# Patient Record
Sex: Female | Born: 1985
Health system: Southern US, Community
[De-identification: ages and names within clinical notes are randomized; demographics above are authoritative.]

## PROBLEM LIST (undated history)

## (undated) ENCOUNTER — Inpatient Hospital Stay (HOSPITAL_COMMUNITY): Payer: Self-pay

## (undated) DIAGNOSIS — D573 Sickle-cell trait: Secondary | ICD-10-CM

## (undated) DIAGNOSIS — M419 Scoliosis, unspecified: Secondary | ICD-10-CM

## (undated) DIAGNOSIS — D259 Leiomyoma of uterus, unspecified: Secondary | ICD-10-CM

## (undated) DIAGNOSIS — D649 Anemia, unspecified: Secondary | ICD-10-CM

## (undated) DIAGNOSIS — A599 Trichomoniasis, unspecified: Secondary | ICD-10-CM

## (undated) HISTORY — PX: ROOT CANAL: SHX2363

## (undated) HISTORY — PX: DG SCOLIOSIS STUDY ENTIRE SPINE (ARMC HX): HXRAD1005

## (undated) HISTORY — PX: CHOLECYSTECTOMY: SHX55

---
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
## 2004-12-12 ENCOUNTER — Emergency Department: Payer: Self-pay | Admitting: Emergency Medicine

## 2005-01-11 ENCOUNTER — Emergency Department: Payer: Self-pay | Admitting: Emergency Medicine

## 2005-03-14 ENCOUNTER — Ambulatory Visit: Payer: Self-pay | Admitting: Unknown Physician Specialty

## 2005-06-27 ENCOUNTER — Emergency Department: Payer: Self-pay | Admitting: Emergency Medicine

## 2005-07-15 ENCOUNTER — Ambulatory Visit: Payer: Self-pay | Admitting: Family Medicine

## 2006-02-23 ENCOUNTER — Emergency Department: Payer: Self-pay | Admitting: Emergency Medicine

## 2006-02-23 ENCOUNTER — Other Ambulatory Visit: Payer: Self-pay

## 2007-01-01 ENCOUNTER — Emergency Department: Payer: Self-pay | Admitting: Emergency Medicine

## 2007-03-06 ENCOUNTER — Emergency Department: Payer: Self-pay | Admitting: Emergency Medicine

## 2007-05-02 ENCOUNTER — Emergency Department: Payer: Self-pay

## 2007-06-07 ENCOUNTER — Observation Stay: Payer: Self-pay | Admitting: Obstetrics and Gynecology

## 2007-06-08 ENCOUNTER — Ambulatory Visit: Payer: Self-pay | Admitting: Obstetrics and Gynecology

## 2007-07-11 ENCOUNTER — Observation Stay: Payer: Self-pay | Admitting: Obstetrics and Gynecology

## 2007-09-19 ENCOUNTER — Observation Stay: Payer: Self-pay | Admitting: Obstetrics and Gynecology

## 2007-10-23 ENCOUNTER — Observation Stay: Payer: Self-pay

## 2007-11-04 ENCOUNTER — Observation Stay: Payer: Self-pay

## 2007-11-05 ENCOUNTER — Inpatient Hospital Stay: Payer: Self-pay

## 2009-07-29 ENCOUNTER — Emergency Department: Payer: Self-pay | Admitting: Emergency Medicine

## 2010-03-22 ENCOUNTER — Emergency Department: Payer: Self-pay | Admitting: Emergency Medicine

## 2010-11-29 ENCOUNTER — Emergency Department: Payer: Self-pay | Admitting: Emergency Medicine

## 2012-08-28 ENCOUNTER — Emergency Department: Payer: Self-pay | Admitting: Emergency Medicine

## 2012-09-14 ENCOUNTER — Emergency Department: Payer: Self-pay | Admitting: Emergency Medicine

## 2013-01-21 ENCOUNTER — Emergency Department: Payer: Self-pay | Admitting: Emergency Medicine

## 2013-01-21 LAB — MONONUCLEOSIS SCREEN: Mono Test: NEGATIVE

## 2013-01-24 LAB — BETA STREP CULTURE(ARMC)

## 2013-03-13 ENCOUNTER — Emergency Department: Payer: Self-pay | Admitting: Emergency Medicine

## 2013-03-13 LAB — CBC
HGB: 13.6 g/dL (ref 12.0–16.0)
MCHC: 34.5 g/dL (ref 32.0–36.0)
MCV: 85 fL (ref 80–100)
RBC: 4.61 10*6/uL (ref 3.80–5.20)
WBC: 10.2 10*3/uL (ref 3.6–11.0)

## 2013-03-13 LAB — BASIC METABOLIC PANEL
Anion Gap: 3 — ABNORMAL LOW (ref 7–16)
Calcium, Total: 9.1 mg/dL (ref 8.5–10.1)
Co2: 29 mmol/L (ref 21–32)
EGFR (Non-African Amer.): >60
Osmolality: 269 (ref 275–301)
Potassium: 3.8 mmol/L (ref 3.5–5.1)
Sodium: 136 mmol/L (ref 136–145)

## 2013-03-13 LAB — URINALYSIS, COMPLETE
Bacteria: NONE SEEN
Bilirubin,UR: NEGATIVE
Glucose,UR: NEGATIVE mg/dL (ref 0–75)
Ketone: NEGATIVE
Leukocyte Esterase: NEGATIVE
Nitrite: NEGATIVE
Ph: 5 (ref 4.5–8.0)
Protein: NEGATIVE
RBC,UR: 2 /HPF (ref 0–5)
Specific Gravity: 1.015 (ref 1.003–1.030)

## 2013-12-05 ENCOUNTER — Emergency Department: Payer: Self-pay | Admitting: Emergency Medicine

## 2015-01-21 ENCOUNTER — Emergency Department (HOSPITAL_COMMUNITY)
Admission: EM | Admit: 2015-01-21 | Discharge: 2015-01-21 | Disposition: A | Discharge status: Home / Self Care | Payer: Self-pay | Attending: Emergency Medicine | Admitting: Emergency Medicine

## 2015-01-21 ENCOUNTER — Encounter (HOSPITAL_COMMUNITY): Payer: Self-pay | Admitting: Emergency Medicine

## 2015-01-21 ENCOUNTER — Emergency Department (HOSPITAL_COMMUNITY): Payer: Self-pay

## 2015-01-21 DIAGNOSIS — Z9049 Acquired absence of other specified parts of digestive tract: Secondary | ICD-10-CM | POA: Insufficient documentation

## 2015-01-21 DIAGNOSIS — H9191 Unspecified hearing loss, right ear: Secondary | ICD-10-CM | POA: Insufficient documentation

## 2015-01-21 DIAGNOSIS — J069 Acute upper respiratory infection, unspecified: Secondary | ICD-10-CM | POA: Insufficient documentation

## 2015-01-21 DIAGNOSIS — R197 Diarrhea, unspecified: Secondary | ICD-10-CM | POA: Insufficient documentation

## 2015-01-21 MED ORDER — HYDROCODONE-HOMATROPINE 5-1.5 MG/5ML PO SYRP: 5.0000 mL | ORAL_SOLUTION | Freq: Four times a day (QID) | ORAL | Status: DC | PRN

## 2015-01-21 MED ORDER — CETIRIZINE-PSEUDOEPHEDRINE ER 5-120 MG PO TB12: 1.0000 | ORAL_TABLET | Freq: Two times a day (BID) | ORAL | Status: DC

## 2015-01-21 NOTE — ED Provider Notes (Signed)
CSN: [id number]     Arrival date & time 01/21/15  2155 History  This chart was scribed for Alvina Chou, PA-C, working with Sherwood Gambler, MD by Steva Colder, ED Scribe. The patient was seen in room WTR8/WTR8 at 11:09 PM.    Chief Complaint  Patient presents with  . Cough  . Diarrhea      The history is provided by the patient. No language interpreter was used.    HPI Comments: Anna Beck is a 29 y.o. female who presents to the Emergency Department complaining of worsening productive cough onset 7 days.  She states that she is having associated symptoms of diarrhea, hearing loss right ear, chest wall tenderness, and voice loss. She states that she has tried mucinex, sudafed, vitamin C, and theraflu, with no relief for her symptoms. She denies fever, chills, sinus pressure, and any other symptoms. Denies smoking or hx of asthma.   No past medical history on file. Past Surgical History  Procedure Laterality Date  . Cholecystectomy    . Dg scoliosis study entire spine ([facility] hx)      States corrective surgery of scoliosis   No family history on file. History  Substance Use Topics  . Smoking status: Not on file  . Smokeless tobacco: Not on file  . Alcohol Use: Not on file   OB History    No data available     Review of Systems  Constitutional: Negative for fever and chills.  HENT: Positive for hearing loss (right ear) and voice change. Negative for sinus pressure.   Respiratory: Positive for cough.   Gastrointestinal: Positive for diarrhea.      Allergies  Review of patient's allergies indicates no known allergies.  Home Medications   Prior to Admission medications   Not on File   BP 117/79 mmHg  Pulse 85  Temp(Src) 98.2 F (36.8 C) (Oral)  Resp 18  SpO2 99%  LMP 01/01/2015 Physical Exam  Constitutional: She is oriented to person, place, and time. She appears well-developed and well-nourished. No distress.  HENT:  Head: Normocephalic and atraumatic.   Left Ear: Tympanic membrane and ear canal normal.  Mouth/Throat: Oropharynx is clear and moist. No oropharyngeal exudate or posterior oropharyngeal erythema.  Right external ear canal mildly swollen. No tenderness with retraction of right auricle. Left ear unremarkable.  Eyes: EOM are normal.  Neck: Neck supple. No tracheal deviation present.  Cardiovascular: Normal rate.   Pulmonary/Chest: Effort normal and breath sounds normal. No respiratory distress. She has no wheezes. She has no rales.  Abdominal: Soft. She exhibits no distension. There is no tenderness. There is no rebound.  Musculoskeletal: Normal range of motion.  Neurological: She is alert and oriented to person, place, and time.  Skin: Skin is warm and dry.  Psychiatric: She has a normal mood and affect. Her behavior is normal.  Nursing note and vitals reviewed.   ED Course  Procedures (including critical care time) DIAGNOSTIC STUDIES: Oxygen Saturation is 99% on RA, nl by my interpretation.    COORDINATION OF CARE: 11:14 PM-Discussed treatment plan which includes decongestant, hycadon with pt at bedside and pt agreed to plan.   Labs Review Labs Reviewed - No data to display  Imaging Review Dg Chest 2 View (if Patient Has Fever And/or Copd)  01/21/2015   CLINICAL DATA:  Chest pain, productive cough starting today  EXAM: CHEST  2 VIEW  COMPARISON:  None.  FINDINGS: Cardiomediastinal silhouette is unremarkable. There is dextroscoliosis of thoracic spine.  Thoracic fixation rods are noted thoracic spine. No acute infiltrate or pulmonary edema.  IMPRESSION: No active cardiopulmonary disease. Postsurgical changes thoracic spine. Mild thoracic dextroscoliosis.   Electronically Signed   By: Lahoma Crocker M.D.   On: 01/21/2015 22:52     EKG Interpretation None      MDM   Final diagnoses:  URI (upper respiratory infection)   11:31 PM Patient's vitals stable and patient afebrile. I personally reviewed the xray which shows no  acute changes. Patient likely has viral URI and will be discharged with hycodan and zyrtec D. Patient instructed to return with worsening or concerning symptoms.   I personally performed the services described in this documentation, which was scribed in my presence. The recorded information has been reviewed and is accurate.    [address] [place], PA-C 01/21/15 9872  Sherwood Gambler, MD 01/27/15 1432

## 2015-01-21 NOTE — ED Notes (Signed)
Pt states having a cough ongoing since last Thursday/friday. States it is occasionally productive, unsure what color sputum. States her chest is sore and her voice is hoarse from coughing so much. States it's also difficult for her to hear out of her left ear where it feels stopped up.

## 2015-01-21 NOTE — Discharge Instructions (Signed)
Take hycodan as needed for cough. Take zyrtec D as directed for congestion relief. Refer to attached documents for more information. Return to the ED with worsening or concerning symptoms.

## 2015-05-26 ENCOUNTER — Emergency Department
Admission: EM | Admit: 2015-05-26 | Discharge: 2015-05-26 | Disposition: A | Discharge status: Home / Self Care | Payer: Self-pay | Attending: Student | Admitting: Student

## 2015-05-26 ENCOUNTER — Encounter: Payer: Self-pay | Admitting: Emergency Medicine

## 2015-05-26 DIAGNOSIS — Z79899 Other long term (current) drug therapy: Secondary | ICD-10-CM | POA: Insufficient documentation

## 2015-05-26 DIAGNOSIS — K0381 Cracked tooth: Secondary | ICD-10-CM | POA: Insufficient documentation

## 2015-05-26 DIAGNOSIS — R0981 Nasal congestion: Secondary | ICD-10-CM | POA: Insufficient documentation

## 2015-05-26 DIAGNOSIS — K0889 Other specified disorders of teeth and supporting structures: Secondary | ICD-10-CM | POA: Insufficient documentation

## 2015-05-26 DIAGNOSIS — Z87891 Personal history of nicotine dependence: Secondary | ICD-10-CM | POA: Insufficient documentation

## 2015-05-26 MED ORDER — IBUPROFEN 800 MG PO TABS: 800.0000 mg | ORAL_TABLET | Freq: Three times a day (TID) | ORAL | Status: DC | PRN

## 2015-05-26 MED ORDER — FEXOFENADINE-PSEUDOEPHED ER 60-120 MG PO TB12: 1.0000 | ORAL_TABLET | Freq: Two times a day (BID) | ORAL | Status: DC

## 2015-05-26 MED ORDER — AMOXICILLIN 500 MG PO CAPS: 500.0000 mg | ORAL_CAPSULE | Freq: Three times a day (TID) | ORAL | Status: DC

## 2015-05-26 NOTE — ED Notes (Signed)
Pt C/O of right upper dental pain since Sunday. Pt also reports having pressure in sinus area with neck pain.

## 2015-05-26 NOTE — ED Provider Notes (Signed)
[facility] Emergency Department Provider Note  ____________________________________________  Time seen: Approximately 8:41 AM  I have reviewed the triage vital signs and the nursing notes.   HISTORY  Chief Complaint Dental Pain    HPI [name] is a 29 y.o. female patient complaining of 2 days of dental pain. Patient shows a dental pain is secondary to a fractured tooth or a sinus congestion but she is having. Patient states she also has a frontal headache. Patient denies any fevers chills associated this complaint. No palliative measures taken for this complaint. Patient is rating her pain discomfort as a 5/10. No palliative measures taken for this complaint patient does have a history allergic rhinitis.   History reviewed. No pertinent past medical history.  There are no active problems to display for this patient.   Past Surgical History  Procedure Laterality Date  . Cholecystectomy    . Dg scoliosis study entire spine ([facility] hx)      States corrective surgery of scoliosis    Current Outpatient Rx  Name  Route  Sig  Dispense  Refill  . amoxicillin (AMOXIL) 500 MG capsule   Oral   Take 1 capsule (500 mg total) by mouth 3 (three) times daily.   30 capsule   0   . cetirizine-pseudoephedrine (ZYRTEC-D) 5-120 MG per tablet   Oral   Take 1 tablet by mouth 2 (two) times daily.   30 tablet   0   . fexofenadine-pseudoephedrine (ALLEGRA-D) 60-120 MG 12 hr tablet   Oral   Take 1 tablet by mouth 2 (two) times daily.   30 tablet   0   . HYDROcodone-homatropine (HYCODAN) 5-1.5 MG/5ML syrup   Oral   Take 5 mLs by mouth every 6 (six) hours as needed for cough.   120 mL   0   . ibuprofen (ADVIL,MOTRIN) 800 MG tablet   Oral   Take 1 tablet (800 mg total) by mouth every 8 (eight) hours as needed for moderate pain.   15 tablet   0     Allergies Review of patient's allergies indicates no known allergies.  History reviewed. No pertinent  family history.  Social History Social History  Substance Use Topics  . Smoking status: Former Research scientist (life sciences)  . Smokeless tobacco: None  . Alcohol Use: Yes    Review of Systems Constitutional: No fever/chills Eyes: No visual changes. ENT: No sore throat. Dental pain. Sinus congestion. Cardiovascular: Denies chest pain. Respiratory: Denies shortness of breath. Gastrointestinal: No abdominal pain.  No nausea, no vomiting.  No diarrhea.  No constipation. Genitourinary: Negative for dysuria. Musculoskeletal: Negative for back pain. Skin: Negative for rash. Neurological: Positive for headaches, denies  focal weakness or numbness. 10-point ROS otherwise negative.  ____________________________________________   PHYSICAL EXAM:  VITAL SIGNS: ED Triage Vitals  Enc Vitals Group     BP 05/26/15 0822 112/96 mmHg     Pulse Rate 05/26/15 0822 65     Resp 05/26/15 0822 15     Temp 05/26/15 0822 98.1 F (36.7 C)     Temp Source 05/26/15 0822 Oral     SpO2 05/26/15 0822 100 %     Weight 05/26/15 0822 207 lb (93.895 kg)     Height 05/26/15 0822 5\' 5"  (1.651 m)     Head Cir --      Peak Flow --      Pain Score --      Pain Loc --      Pain  Edu? --      Excl. in [place]? --     Constitutional: Alert and oriented. Well appearing and in no acute distress. Eyes: Conjunctivae are normal. PERRL. EOMI. Head: Atraumatic. Nose: Bilateral maxillary guarding. Edematous nasal turbinates.   Mouth/Throat: Mucous membranes are moist.  Oropharynx non-erythematous. Postnasal drainage. Fracture right molar. Neck: No stridor.  No cervical spine tenderness to palpation. Hematological/Lymphatic/Immunilogical: No cervical lymphadenopathy. Cardiovascular: Normal rate, regular rhythm. Grossly normal heart sounds.  Good peripheral circulation. Respiratory: Normal respiratory effort.  No retractions. Lungs CTAB. Gastrointestinal: Soft and nontender. No distention. No abdominal bruits. No CVA  tenderness. Musculoskeletal: No lower extremity tenderness nor edema.  No joint effusions. Neurologic:  Normal speech and language. No gross focal neurologic deficits are appreciated. No gait instability. Skin:  Skin is warm, dry and intact. No rash noted. Psychiatric: Mood and affect are normal. Speech and behavior are normal.  ____________________________________________   LABS (all labs ordered are listed, but only abnormal results are displayed)  Labs Reviewed - No data to display ____________________________________________  EKG   ____________________________________________  RADIOLOGY   ____________________________________________   PROCEDURES  Procedure(s) performed: None  Critical Care performed: No  ____________________________________________   INITIAL IMPRESSION / ASSESSMENT AND PLAN / ED COURSE  Pertinent labs & imaging results that were available during my care of the patient were reviewed by me and considered in my medical decision making (see chart for details).  Sinus congestion and dental pain. Patient will be given prescription for Allegra-D, and ibuprofen. Patient advised follow-up with the walk-in dental clinic today. Patient given a work note for today. ____________________________________________   FINAL CLINICAL IMPRESSION(S) / ED DIAGNOSES  Final diagnoses:  Sinus congestion  Pain, dental      Sable Feil, PA-C 05/26/15 3235  Joanne Gavel, MD 05/26/15 [phone]

## 2015-05-26 NOTE — Discharge Instructions (Signed)
Follow up with dental clinic [facility]  [place] [facility] and [place] [email or web address] [phone])  [facility] [phone])  [place] [phone] ext [phone])  [place] [phone])  [facility] [phone]) This clinic caters to the indigent population and is on a lottery system. Location: [employer] [facility], [place], [place], [place] Clinic Hours: Wednesdays from 6pm - 9pm, patients seen by a lottery system. For dates, call or go to [email or web address] Services: Cleanings, fillings and simple extractions. Payment Options: DENTAL WORK IS FREE OF CHARGE. Bring proof of income or support. Best way to get seen: Arrive at 5:15 pm - this is a lottery, NOT first come/first serve, so arriving earlier will not increase your chances of being seen.     [facility] [phone] Select option 1 for emergencies   Location: [facility], [place], [address] [place] Clinic Hours: No walk-ins accepted - call the day before to schedule an appointment. Check in times are 9:30 am and 1:30 pm. Services: Simple extractions, temporary fillings, pulpectomy/pulp debridement, uncomplicated abscess drainage. Payment Options: PAYMENT IS DUE AT THE TIME OF SERVICE.  Fee is usually $100-200, additional surgical procedures (e.g. abscess drainage) may be extra. Cash, checks, Visa/MasterCard accepted.  Can file Medicaid if patient is covered for dental - patient should call case worker to check. No discount for [facility] patients. Best way to get seen: MUST call the day before and get onto the schedule. Can usually be seen the next 1-2 days. No walk-ins accepted.     [place] [phone]   Location: [place], [place] Clinic Hours: M, W, Th, F 8am or 1:30pm, Tues 9a or 1:30 - first come/first served. Services: Simple extractions, temporary fillings, uncomplicated abscess drainage.  You do not need to be an [facility] resident. Payment Options: PAYMENT IS DUE AT THE TIME OF SERVICE. Dental insurance, otherwise sliding scale - bring proof of income or support. Depending on income and treatment needed, cost is usually $50-200. Best way to get seen: Arrive early as it is first come/first served.     [facility] [phone]   Location: [place] [facility] Hours: Mon-Thu 8a-5p Services: Most basic dental services including extractions and fillings. Payment Options: PAYMENT IS DUE AT THE TIME OF SERVICE. Sliding scale, up to 50% off - bring proof if income or support. Medicaid with dental option accepted. Best way to get seen: Call to schedule an appointment, can usually be seen within 2 weeks OR they will try to see walk-ins - show up at [place] or 2p (you may have to wait).     [facility] RESIDENTS ONLY   Location: [facility], [place] [address] [place], [place] [address] Clinic Hours: By appointment only. Monday - Thursday 8am-5pm, Friday 8am-12pm Services: Cleanings, fillings, extractions. Payment Options: PAYMENT IS DUE AT THE TIME OF SERVICE. Cash, Visa or MasterCard. Sliding scale - $30 minimum per service. Best way to get seen: Come in to office, complete packet and make an appointment - need proof of income or support monies for each household member and proof of [facility] residence. Usually takes about a month to get in.     [facility] [phone]  Location: [address], [place] Clinic Hours: Walk-in Urgent Care Dental Services are  offered Monday-Friday mornings only. The numbers of emergencies accepted daily is limited to the number of providers available. Maximum 15 - Mondays, Wednesdays & Thursdays Maximum 10 - Tuesdays & Fridays Services: You do not need to be a [facility] resident to be seen for a dental emergency. Emergencies are defined as pain, swelling, abnormal bleeding, or dental trauma. Walkins will receive x-rays if needed. NOTE: Dental cleaning is not an emergency. Payment Options: PAYMENT IS DUE AT THE TIME OF SERVICE. Minimum co-pay is $40.00 for uninsured patients. Minimum co-pay is $3.00 for Medicaid with dental coverage. Dental Insurance is accepted and must be presented at time of visit. Medicare does not cover dental. Forms of payment: Cash, credit card, checks. Best way to get seen: If not previously registered with the clinic, walk-in dental registration begins at 7:15 am and is on a first come/first serve basis. If previously registered with the clinic, call to make an appointment.     [facility] [place] ONLY   Location: [address], [place], [place] Clinic Hours: Mon-Thu 10a-2p Services: Extractions only! Payment Options: FREE (donations accepted) - bring proof of income or support Best way to get seen: Call and schedule an appointment OR come at 8am on the 1st Monday of every month (except for holidays) when it is first come/first served.     [facility] [phone]   Location: [place], [place] Clinic Hours: Friday mornings Services, Payment Options, Best way to get seen: Call for info

## 2016-07-12 ENCOUNTER — Encounter (HOSPITAL_COMMUNITY): Payer: Self-pay | Admitting: Family Medicine

## 2016-07-12 ENCOUNTER — Emergency Department (HOSPITAL_COMMUNITY)
Admission: EM | Admit: 2016-07-12 | Discharge: 2016-07-13 | Disposition: A | Discharge status: Home / Self Care | Payer: BLUE CROSS/BLUE SHIELD | Attending: Emergency Medicine | Admitting: Emergency Medicine

## 2016-07-12 DIAGNOSIS — N10 Acute tubulo-interstitial nephritis: Secondary | ICD-10-CM | POA: Diagnosis not present

## 2016-07-12 DIAGNOSIS — R1031 Right lower quadrant pain: Secondary | ICD-10-CM | POA: Diagnosis present

## 2016-07-12 DIAGNOSIS — Z87891 Personal history of nicotine dependence: Secondary | ICD-10-CM | POA: Diagnosis not present

## 2016-07-12 DIAGNOSIS — Z79899 Other long term (current) drug therapy: Secondary | ICD-10-CM | POA: Insufficient documentation

## 2016-07-12 DIAGNOSIS — N12 Tubulo-interstitial nephritis, not specified as acute or chronic: Secondary | ICD-10-CM | POA: Diagnosis not present

## 2016-07-12 LAB — COMPREHENSIVE METABOLIC PANEL
ALT: 11 U/L — ABNORMAL LOW (ref 14–54)
AST: 15 U/L (ref 15–41)
Albumin: 4 g/dL (ref 3.5–5.0)
Alkaline Phosphatase: 60 U/L (ref 38–126)
Anion gap: 7 (ref 5–15)
BUN: 8 mg/dL (ref 6–20)
CHLORIDE: 105 mmol/L (ref 101–111)
CO2: 26 mmol/L (ref 22–32)
Calcium: 9.3 mg/dL (ref 8.9–10.3)
Creatinine, Ser: 0.66 mg/dL (ref 0.44–1.00)
GFR calc non Af Amer: >60 mL/min (ref >60)
Glucose, Bld: 95 mg/dL (ref 65–99)
Potassium: 3.5 mmol/L (ref 3.5–5.1)
Sodium: 138 mmol/L (ref 135–145)
Total Bilirubin: 0.4 mg/dL (ref 0.3–1.2)
Total Protein: 7.2 g/dL (ref 6.5–8.1)

## 2016-07-12 LAB — URINALYSIS, ROUTINE W REFLEX MICROSCOPIC
BILIRUBIN URINE: NEGATIVE
Glucose, UA: NEGATIVE mg/dL
Ketones, ur: NEGATIVE mg/dL
Nitrite: POSITIVE — AB
PH: 6 (ref 5.0–8.0)
Protein, ur: NEGATIVE mg/dL
Specific Gravity, Urine: 1.011 (ref 1.005–1.030)

## 2016-07-12 LAB — POC URINE PREG, ED: Preg Test, Ur: NEGATIVE

## 2016-07-12 LAB — CBC
HEMATOCRIT: 36.5 % (ref 36.0–46.0)
Hemoglobin: 12.4 g/dL (ref 12.0–15.0)
MCH: 28.4 pg (ref 26.0–34.0)
MCHC: 34 g/dL (ref 30.0–36.0)
MCV: 83.7 fL (ref 78.0–100.0)
PLATELETS: 287 10*3/uL (ref 150–400)
RBC: 4.36 MIL/uL (ref 3.87–5.11)
RDW: 13.4 % (ref 11.5–15.5)
WBC: 7.9 10*3/uL (ref 4.0–10.5)

## 2016-07-12 LAB — LIPASE, BLOOD: LIPASE: 28 U/L (ref 11–51)

## 2016-07-12 MED ORDER — MORPHINE SULFATE (PF) 4 MG/ML IV SOLN
4.0000 mg | Freq: Once | INTRAVENOUS | Status: AC
  Administered 2016-07-12: 4 mg via INTRAVENOUS
  Filled 2016-07-12: qty 1

## 2016-07-12 MED ORDER — DEXTROSE 5 % IV SOLN
1.0000 g | INTRAVENOUS | Status: DC
  Administered 2016-07-12: 1 g via INTRAVENOUS
  Filled 2016-07-12: qty 10

## 2016-07-12 MED ORDER — OXYCODONE HCL 5 MG PO TABS: 10.0000 mg | ORAL_TABLET | Freq: Once | ORAL | Status: DC

## 2016-07-12 MED ORDER — KETOROLAC TROMETHAMINE 15 MG/ML IJ SOLN: 15.0000 mg | Freq: Once | INTRAMUSCULAR | Status: DC

## 2016-07-12 NOTE — Progress Notes (Signed)
EDCM went to speak to patient at bedside, however nurse at bedside at that time.

## 2016-07-12 NOTE — ED Triage Notes (Signed)
Patient is reporting right lower abd pain and back pain with pain when urinating. Patient reports symptoms started 3 hours ago. Denies nausea, vomiting, diarrhea, or fever. Took IBUPROFEN 400mg  about 1.5 hrs ago with no relief.

## 2016-07-12 NOTE — ED Notes (Signed)
Pt reminded of the need for urine.  

## 2016-07-12 NOTE — ED Provider Notes (Signed)
Anna Beck   CSN: [id number] Arrival date & time: 07/12/16  1819     History   Chief Complaint Chief Complaint  Patient presents with  . Abdominal Pain  . Back Pain    HPI Anna Beck is a 30 y.o. female.  HPI Pt comes in with cc of abd pain. PT has hx of cholecystectomy. PT reports that for the past 2-3 days she has been having intermittent R sided abd and back pain. Pain is sharp. Starting about 3 hours ago though, she has had constant RLQ abdp ain radiating to the back. PT also reports some dysuria and urinary frequency for the past several days. She has no n/v/f/c. Pt has no vaginal discharge/bleeding.  History reviewed. No pertinent past medical history.  There are no active problems to display for this patient.   Past Surgical History:  Procedure Laterality Date  . CHOLECYSTECTOMY    . DG SCOLIOSIS STUDY ENTIRE SPINE ([facility] HX)     States corrective surgery of scoliosis    OB History    No data available       Home Medications    Prior to Admission medications   Medication Sig Start Date End Date Taking? Authorizing Provider  ibuprofen (ADVIL,MOTRIN) 200 MG tablet Take 400 mg by mouth every 6 (six) hours as needed for moderate pain.   Yes Historical Provider, MD  amoxicillin (AMOXIL) 500 MG capsule Take 1 capsule (500 mg total) by mouth 3 (three) times daily. Patient not taking: Reported on 07/12/2016 05/26/15   Sable Feil, PA-C  cetirizine-pseudoephedrine (ZYRTEC-D) 5-120 MG per tablet Take 1 tablet by mouth 2 (two) times daily. Patient not taking: Reported on 07/12/2016 01/21/15   Alvina Chou, PA-C  fexofenadine-pseudoephedrine (ALLEGRA-D) 60-120 MG 12 hr tablet Take 1 tablet by mouth 2 (two) times daily. Patient not taking: Reported on 07/12/2016 05/26/15   Sable Feil, PA-C  HYDROcodone-homatropine [facility]) 5-1.5 MG/5ML syrup Take 5 mLs by mouth every 6 (six) hours as needed for cough. Patient not taking: Reported  on 07/12/2016 01/21/15   Alvina Chou, PA-C  ibuprofen (ADVIL,MOTRIN) 800 MG tablet Take 1 tablet (800 mg total) by mouth every 8 (eight) hours as needed for moderate pain. Patient not taking: Reported on 07/12/2016 05/26/15   Sable Feil, PA-C    Family History History reviewed. No pertinent family history.  Social History Social History  Substance Use Topics  . Smoking status: Former Research scientist (life sciences)  . Smokeless tobacco: Never Used  . Alcohol use Yes     Comment: Twice a month     Allergies   Patient has no known allergies.   Review of Systems Review of Systems  ROS 10 Systems reviewed and are negative for acute change except as noted in the HPI.     Physical Exam Updated Vital Signs BP 105/65 (BP Location: Left Arm)   Pulse 71   Temp 97.5 F (36.4 C) (Oral)   Resp 18   Ht 5\' 6"  (1.676 m)   Wt 220 lb (99.8 kg)   LMP 06/29/2016 Comment: Upreg neg 07/12/16  SpO2 100%   BMI 35.51 kg/m   Physical Exam  Constitutional: She is oriented to person, place, and time. She appears well-developed and well-nourished.  HENT:  Head: Normocephalic and atraumatic.  Eyes: EOM are normal. Pupils are equal, round, and reactive to light.  Neck: Neck supple.  Cardiovascular: Normal rate, regular rhythm and normal heart sounds.   No murmur heard. Pulmonary/Chest: Effort  normal. No respiratory distress.  Abdominal: Soft. She exhibits no distension. There is tenderness. There is guarding. There is no rebound.  RLQ abdominal tenderness, with guarding. + flank tenderness on the R side as well.  Neurological: She is alert and oriented to person, place, and time.  Skin: Skin is warm and dry.  Nursing Beck and vitals reviewed.    ED Treatments / Results  Labs (all labs ordered are listed, but only abnormal results are displayed) Labs Reviewed  COMPREHENSIVE METABOLIC PANEL - Abnormal; Notable for the following:       Result Value   ALT 11 (*)    All other components within  normal limits  URINALYSIS, ROUTINE W REFLEX MICROSCOPIC - Abnormal; Notable for the following:    APPearance HAZY (*)    Hgb urine dipstick SMALL (*)    Nitrite POSITIVE (*)    Leukocytes, UA MODERATE (*)    Bacteria, UA RARE (*)    Squamous Epithelial / LPF 0-5 (*)    All other components within normal limits  URINE CULTURE  LIPASE, BLOOD  CBC  POC URINE PREG, ED    EKG  EKG Interpretation None       Radiology No results found.  Procedures Procedures (including critical care time)  Medications Ordered in ED Medications  cefTRIAXone (ROCEPHIN) 1 g in dextrose 5 % 50 mL IVPB (0 g Intravenous Stopped 07/12/16 2201)  iopamidol (ISOVUE-300) 61 % injection (not administered)  sodium chloride 0.9 % injection (not administered)  morphine 4 MG/ML injection 4 mg (4 mg Intravenous Given 07/12/16 2126)  iopamidol (ISOVUE-300) 61 % injection 100 mL (100 mLs Intravenous Contrast Given 07/13/16 0027)     Initial Impression / Assessment and Plan / ED Course  I have reviewed the triage vital signs and the nursing notes.  Pertinent labs & imaging results that were available during my care of the patient were reviewed by me and considered in my medical decision making (see chart for details).  Clinical Course as of Jul 13 116  Wed Jul 13, 2016  0013 Although pain is overall improved, pt is having focal RLQ tenderness over Mcburney's. CT ordered to r/o appendicitis.  [AN]    Clinical Course User Index [AN] Anna Biles, MD    Pt comes in with cc of R sided abd pain. Pain intermittent initially, but now constant and more severe. PT having urinary symptoms for the past several days consistent with UTI and UA is nitrite +. Pt has no pelvic disorders.  DDX Pyelonephritis Ureteral colic Appendicitis Ovarian torsion PID  Based on hx and exam, pelvic etiology deemed less likely. We will start treating patient as pyelonephritis and repeat abd pain to see if CT is needed. Pt  has pretty focal RLQ tenderness and she thinks that pain in the abdomen is worse than her back, which makes me wonder if she has both a pyelonephritis and additional process going on.   Final Clinical Impressions(s) / ED Diagnoses   Final diagnoses:  None    New Prescriptions New Prescriptions   No medications on file     Anna Biles, MD 07/13/16 [phone]

## 2016-07-13 ENCOUNTER — Emergency Department (HOSPITAL_COMMUNITY): Payer: BLUE CROSS/BLUE SHIELD

## 2016-07-13 MED ORDER — HYDROCODONE-ACETAMINOPHEN 5-325 MG PO TABS: 1.0000 | ORAL_TABLET | Freq: Four times a day (QID) | ORAL | 0 refills | Status: DC | PRN

## 2016-07-13 MED ORDER — SODIUM CHLORIDE 0.9 % IJ SOLN
INTRAMUSCULAR | Status: AC
  Filled 2016-07-13: qty 50

## 2016-07-13 MED ORDER — IOPAMIDOL (ISOVUE-300) INJECTION 61%
100.0000 mL | Freq: Once | INTRAVENOUS | Status: AC | PRN
  Administered 2016-07-13: 100 mL via INTRAVENOUS

## 2016-07-13 MED ORDER — IOPAMIDOL (ISOVUE-300) INJECTION 61%
INTRAVENOUS | Status: AC
  Filled 2016-07-13: qty 100

## 2016-07-13 MED ORDER — ONDANSETRON 4 MG PO TBDP: 4.0000 mg | ORAL_TABLET | Freq: Three times a day (TID) | ORAL | 0 refills | Status: DC | PRN

## 2016-07-13 MED ORDER — CEPHALEXIN 500 MG PO CAPS: 500.0000 mg | ORAL_CAPSULE | Freq: Two times a day (BID) | ORAL | 0 refills | Status: DC

## 2016-07-13 NOTE — Discharge Instructions (Signed)
You have a kidney infection based on our workup in the Er. Take the antibiotics prescribed and see your doctor in a week.  Please return to the ER if your symptoms worsen; you have increased pain, fevers, chills, inability to keep any medications down, confusion. Otherwise see the outpatient doctor as requested.

## 2016-07-15 LAB — URINE CULTURE: Culture: >=100000 — AB

## 2016-07-16 ENCOUNTER — Telehealth: Payer: Self-pay

## 2016-07-16 NOTE — Progress Notes (Signed)
ED Antimicrobial Stewardship Positive Culture Follow Up    Chief Complaint  Patient presents with  . Abdominal Pain  . Back Pain    Recent Results (from the past 720 hour(s))  Urine culture     Status: Abnormal   Collection Time: 07/12/16  6:30 PM  Result Value Ref Range Status   Specimen Description URINE, RANDOM  Final   Special Requests NONE  Final   Culture (A)  Final    >=100,000 COLONIES/mL ESCHERICHIA COLI Confirmed Extended Spectrum Beta-Lactamase Producer (ESBL) Performed at [facility]    Report Status 07/15/2016 FINAL  Final   Organism ID, Bacteria ESCHERICHIA COLI (A)  Final      Susceptibility   Escherichia coli - MIC*    AMPICILLIN >=32 RESISTANT Resistant     CEFAZOLIN >=64 RESISTANT Resistant     CEFTRIAXONE >=64 RESISTANT Resistant     CIPROFLOXACIN <=0.25 SENSITIVE Sensitive     GENTAMICIN <=1 SENSITIVE Sensitive     IMIPENEM <=0.25 SENSITIVE Sensitive     NITROFURANTOIN 64 INTERMEDIATE Intermediate     TRIMETH/SULFA >=320 RESISTANT Resistant     AMPICILLIN/SULBACTAM 16 INTERMEDIATE Intermediate     PIP/TAZO <=4 SENSITIVE Sensitive     Extended ESBL POSITIVE Resistant     * >=100,000 COLONIES/mL ESCHERICHIA COLI    [x]  Treated with Cephalexin, organism resistant to prescribed antimicrobial []  Patient discharged originally without antimicrobial agent and treatment is now indicated  New antibiotic prescription: Ciprofloxacin 500 mg PO BID x 7 days  ED Provider: Jerilynn Mages. Camprubi-Soms, PA-C     Hughes Better, PharmD, BCPS Clinical Pharmacist 07/16/2016 9:38 AM

## 2016-07-16 NOTE — Telephone Encounter (Signed)
Post ED Visit - Positive Culture Follow-up: Unsuccessful Patient Follow-up  Culture assessed and recommendations reviewed by: []  Elenor Quinones, Pharm.D. []  Heide Guile, Pharm.D., BCPS []  Parks Neptune, Pharm.D. []  Alycia Rossetti, Pharm.D., BCPS []  [place], Pharm.D., BCPS, AAHIVP []  Legrand Como, Pharm.D., BCPS, AAHIVP []  Cassie Stewart, Pharm.D. []  Stephens November, Pharm.D. Ebony Hail Masters Pharm D Positive urine culture  []  Patient discharged without antimicrobial prescription and treatment is now indicated [x]  Organism is resistant to prescribed ED discharge antimicrobial []  Patient with positive blood cultures   Unable to contact patient after 3 attempts, letter will be sent to address on file  Genia Del 07/16/2016, 10:35 AM

## 2016-08-02 ENCOUNTER — Telehealth (HOSPITAL_BASED_OUTPATIENT_CLINIC_OR_DEPARTMENT_OTHER): Payer: Self-pay | Admitting: Emergency Medicine

## 2016-10-13 DIAGNOSIS — S92514A Nondisplaced fracture of proximal phalanx of right lesser toe(s), initial encounter for closed fracture: Secondary | ICD-10-CM | POA: Diagnosis not present

## 2017-02-18 DIAGNOSIS — Z0000 Encounter for general adult medical examination without abnormal findings: Secondary | ICD-10-CM | POA: Diagnosis not present

## 2017-02-18 DIAGNOSIS — D539 Nutritional anemia, unspecified: Secondary | ICD-10-CM | POA: Diagnosis not present

## 2017-02-18 DIAGNOSIS — E559 Vitamin D deficiency, unspecified: Secondary | ICD-10-CM | POA: Diagnosis not present

## 2017-02-18 DIAGNOSIS — Z114 Encounter for screening for human immunodeficiency virus [HIV]: Secondary | ICD-10-CM | POA: Diagnosis not present

## 2017-02-18 DIAGNOSIS — R5383 Other fatigue: Secondary | ICD-10-CM | POA: Diagnosis not present

## 2017-03-10 DIAGNOSIS — K529 Noninfective gastroenteritis and colitis, unspecified: Secondary | ICD-10-CM | POA: Diagnosis not present

## 2017-03-10 DIAGNOSIS — E559 Vitamin D deficiency, unspecified: Secondary | ICD-10-CM | POA: Diagnosis not present

## 2017-03-28 ENCOUNTER — Encounter (HOSPITAL_COMMUNITY): Payer: Self-pay | Admitting: Emergency Medicine

## 2017-03-28 ENCOUNTER — Emergency Department (HOSPITAL_COMMUNITY): Payer: BLUE CROSS/BLUE SHIELD

## 2017-03-28 ENCOUNTER — Emergency Department (HOSPITAL_COMMUNITY)
Admission: EM | Admit: 2017-03-28 | Discharge: 2017-03-28 | Disposition: A | Discharge status: Home / Self Care | Payer: BLUE CROSS/BLUE SHIELD | Attending: Emergency Medicine | Admitting: Emergency Medicine

## 2017-03-28 ENCOUNTER — Emergency Department (HOSPITAL_COMMUNITY): Admission: EM | Admit: 2017-03-28 | Discharge: 2017-03-28 | Discharge status: Absent without leave | Payer: Self-pay

## 2017-03-28 DIAGNOSIS — M546 Pain in thoracic spine: Secondary | ICD-10-CM | POA: Diagnosis not present

## 2017-03-28 DIAGNOSIS — Z87891 Personal history of nicotine dependence: Secondary | ICD-10-CM | POA: Diagnosis not present

## 2017-03-28 DIAGNOSIS — M545 Low back pain: Secondary | ICD-10-CM | POA: Diagnosis not present

## 2017-03-28 LAB — URINALYSIS, ROUTINE W REFLEX MICROSCOPIC
BILIRUBIN URINE: NEGATIVE
GLUCOSE, UA: NEGATIVE mg/dL
KETONES UR: NEGATIVE mg/dL
LEUKOCYTES UA: NEGATIVE
Nitrite: NEGATIVE
PH: 7 (ref 5.0–8.0)
Protein, ur: NEGATIVE mg/dL
Specific Gravity, Urine: 1.01 (ref 1.005–1.030)

## 2017-03-28 LAB — URINALYSIS, MICROSCOPIC (REFLEX)
Bacteria, UA: NONE SEEN
RBC / HPF: NONE SEEN RBC/hpf (ref 0–5)
Squamous Epithelial / LPF: NONE SEEN
WBC UA: NONE SEEN WBC/hpf (ref 0–5)

## 2017-03-28 LAB — PREGNANCY, URINE: Preg Test, Ur: NEGATIVE

## 2017-03-28 MED ORDER — HYDROCODONE-ACETAMINOPHEN 5-325 MG PO TABS: 2.0000 | ORAL_TABLET | ORAL | 0 refills | Status: DC | PRN

## 2017-03-28 MED ORDER — METHOCARBAMOL 500 MG PO TABS
500.0000 mg | ORAL_TABLET | Freq: Once | ORAL | Status: AC
  Administered 2017-03-28: 500 mg via ORAL
  Filled 2017-03-28: qty 1

## 2017-03-28 MED ORDER — HYDROCODONE-ACETAMINOPHEN 5-325 MG PO TABS
2.0000 | ORAL_TABLET | Freq: Once | ORAL | Status: AC
  Administered 2017-03-28: 2 via ORAL
  Filled 2017-03-28: qty 2

## 2017-03-28 MED ORDER — METHOCARBAMOL 500 MG PO TABS: 500.0000 mg | ORAL_TABLET | Freq: Two times a day (BID) | ORAL | 0 refills | Status: DC

## 2017-03-28 MED ORDER — DICLOFENAC SODIUM 75 MG PO TBEC: 75.0000 mg | DELAYED_RELEASE_TABLET | Freq: Two times a day (BID) | ORAL | 0 refills | Status: DC

## 2017-03-28 NOTE — ED Provider Notes (Signed)
[place] DEPT Provider Note   CSN: [id number] Arrival date & time: 03/28/17  0808     History   Chief Complaint Chief Complaint  Patient presents with  . Back Pain    HPI Anna Beck is a 30 y.o. female.  The history is provided by the patient. No language interpreter was used.  Back Pain   This is a new problem. The problem occurs constantly. The problem has been gradually worsening. The pain is associated with no known injury. The pain is present in the thoracic spine and lumbar spine. The quality of the pain is described as stabbing. The pain is severe. The symptoms are aggravated by certain positions. The pain is the same all the time. Pertinent negatives include no fever. She has tried nothing for the symptoms. The treatment provided moderate relief.  Pt complains of low back pain that started on Sunday.  Pt reports pain is radiating to shoulder and upper back.  Pt has a history of scoliosis with [place]  rods to correct  History reviewed. No pertinent past medical history.  There are no active problems to display for this patient.   Past Surgical History:  Procedure Laterality Date  . CHOLECYSTECTOMY    . DG SCOLIOSIS STUDY ENTIRE SPINE ([place] HX)     States corrective surgery of scoliosis    OB History    No data available       Home Medications    Prior to Admission medications   Medication Sig Start Date End Date Taking? Authorizing Provider  naproxen sodium (ALEVE) 220 MG tablet Take 440 mg by mouth 2 (two) times daily as needed (pain).   Yes [provider]  cephALEXin (KEFLEX) 500 MG capsule Take 1 capsule (500 mg total) by mouth 2 (two) times daily. Patient not taking: Reported on 03/28/2017 07/13/16   Varney Biles, MD  HYDROcodone-acetaminophen (NORCO/VICODIN) 5-325 MG tablet Take 1 tablet by mouth every 6 (six) hours as needed. Patient not taking: Reported on 03/28/2017 07/13/16   Varney Biles, MD  ondansetron (ZOFRAN ODT) 4 MG  disintegrating tablet Take 1 tablet (4 mg total) by mouth every 8 (eight) hours as needed for nausea or vomiting. Patient not taking: Reported on 03/28/2017 07/13/16   Varney Biles, MD    Family History History reviewed. No pertinent family history.  Social History Social History  Substance Use Topics  . Smoking status: Former Research scientist (life sciences)  . Smokeless tobacco: Never Used  . Alcohol use Yes     Comment: Twice a month     Allergies   Percocet [oxycodone-acetaminophen]   Review of Systems Review of Systems  Constitutional: Negative for fever.  Musculoskeletal: Positive for back pain.  All other systems reviewed and are negative.    Physical Exam Updated Vital Signs BP 111/64   Pulse (!) 57   Temp 98.5 F (36.9 C) (Oral)   Resp 15   LMP 03/08/2017 (Exact Date) Comment: negative urine preg test  SpO2 100%   Physical Exam  Constitutional: She appears well-developed and well-nourished.  HENT:  Head: Normocephalic.  Right Ear: External ear normal.  Left Ear: External ear normal.  Mouth/Throat: Oropharynx is clear and moist.  Cardiovascular: Normal rate and normal heart sounds.   Pulmonary/Chest: Effort normal and breath sounds normal.  Abdominal: Soft. Bowel sounds are normal.  Musculoskeletal: Normal range of motion.  Tender thoracic and lumbar spine  Neurological: She is alert.  Skin: Skin is warm.  Psychiatric: She has a normal mood and  affect.  Nursing note and vitals reviewed.    ED Treatments / Results  Labs (all labs ordered are listed, but only abnormal results are displayed) Labs Reviewed  URINALYSIS, ROUTINE W REFLEX MICROSCOPIC - Abnormal; Notable for the following:       Result Value   Hgb urine dipstick TRACE (*)    All other components within normal limits  PREGNANCY, URINE  URINALYSIS, MICROSCOPIC (REFLEX)    EKG  EKG Interpretation None       Radiology Dg Thoracic Spine 2 View  Result Date: 03/28/2017 CLINICAL DATA:  History of  scoliosis with corrective surgery. Patient complains of progressive low back pain since Sunday radiating to the right leg. EXAM: THORACIC SPINE 2 VIEWS COMPARISON:  None. FINDINGS: There is no evidence of thoracic spine fracture. There is scoliosis of spine. Spinal rod is identified. No other significant bone abnormalities are identified. IMPRESSION: Scoliosis of spine with spinal rods in place. No acute abnormality noted. Electronically Signed   By: Abelardo Diesel M.D.   On: 03/28/2017 13:38   Dg Lumbar Spine Complete  Result Date: 03/28/2017 CLINICAL DATA:  History of scoliosis with corrective surgery. Patient complains of progressive low back pain radiating to the right leg. EXAM: LUMBAR SPINE - COMPLETE 4+ VIEW COMPARISON:  None. FINDINGS: There is no evidence of lumbar spine fracture. Spinal rods are identified in the lower thoracic spine extending to the upper lumbar spine. Intervertebral disc spaces are maintained. IMPRESSION: No acute abnormality. Electronically Signed   By: Abelardo Diesel M.D.   On: 03/28/2017 13:40    Procedures Procedures (including critical care time)  Medications Ordered in ED Medications  HYDROcodone-acetaminophen (NORCO/VICODIN) 5-325 MG per tablet 2 tablet (2 tablets Oral Given 03/28/17 1200)  methocarbamol (ROBAXIN) tablet 500 mg (500 mg Oral Given 03/28/17 1201)     Initial Impression / Assessment and Plan / ED Course  I have reviewed the triage vital signs and the nursing notes.  Pertinent labs & imaging results that were available during my care of the patient were reviewed by me and considered in my medical decision making (see chart for details).       Final Clinical Impressions(s) / ED Diagnoses   Final diagnoses:  Acute low back pain, unspecified back pain laterality, with sciatica presence unspecified    New Prescriptions New Prescriptions   No medications on file   An After Visit Summary was printed and given to the patient. Meds ordered  this encounter  Medications  . naproxen sodium (ALEVE) 220 MG tablet    Sig: Take 440 mg by mouth 2 (two) times daily as needed (pain).  Marland Kitchen HYDROcodone-acetaminophen (NORCO/VICODIN) 5-325 MG per tablet 2 tablet  . methocarbamol (ROBAXIN) tablet 500 mg  . diclofenac (VOLTAREN) 75 MG EC tablet    Sig: Take 1 tablet (75 mg total) by mouth 2 (two) times daily.    Dispense:  14 tablet    Refill:  0    Order Specific Question:   Supervising Provider    Answer:   MILLER, BRIAN [[id number]]  . methocarbamol (ROBAXIN) 500 MG tablet    Sig: Take 1 tablet (500 mg total) by mouth 2 (two) times daily.    Dispense:  20 tablet    Refill:  0    Order Specific Question:   Supervising Provider    Answer:   MILLER, BRIAN [[id number]]  . HYDROcodone-acetaminophen (NORCO/VICODIN) 5-325 MG tablet    Sig: Take 2 tablets by mouth every 4 (four) hours as  needed.    Dispense:  10 tablet    Refill:  0    Order Specific Question:   Supervising Provider    Answer:   Noemi Chapel [[id number]]     Fransico Meadow, PA-C 03/28/17 1451    Virgel Manifold, MD 03/28/17 [phone]

## 2017-03-28 NOTE — ED Triage Notes (Signed)
Pt with hx scoliosis and corrective surgery c/o progressive low back pain onset Sunday, radiating into right leg and shoulder worse with movement. Self-treating with Aleve without relief. Intermittent low back pain is ongoing, but leg and shoulder pain is new.

## 2017-03-28 NOTE — Discharge Instructions (Signed)
Return if any problems.

## 2017-03-28 NOTE — ED Notes (Signed)
Pt is alert and oriented x 4 and is verbally responsive. Pt is sitting up in bed tearful c/o pain 9/10 to lower back that radiates to Rt shoulder. Pt was administer pain medication.

## 2017-06-20 DIAGNOSIS — Z3009 Encounter for other general counseling and advice on contraception: Secondary | ICD-10-CM | POA: Diagnosis not present

## 2017-06-20 DIAGNOSIS — Z3200 Encounter for pregnancy test, result unknown: Secondary | ICD-10-CM | POA: Diagnosis not present

## 2017-06-27 ENCOUNTER — Ambulatory Visit: Payer: BLUE CROSS/BLUE SHIELD

## 2017-06-29 DIAGNOSIS — J019 Acute sinusitis, unspecified: Secondary | ICD-10-CM | POA: Diagnosis not present

## 2017-07-27 ENCOUNTER — Other Ambulatory Visit (HOSPITAL_COMMUNITY): Payer: Self-pay | Admitting: Nurse Practitioner

## 2017-07-27 DIAGNOSIS — Z3A13 13 weeks gestation of pregnancy: Secondary | ICD-10-CM

## 2017-07-27 DIAGNOSIS — Z3481 Encounter for supervision of other normal pregnancy, first trimester: Secondary | ICD-10-CM | POA: Diagnosis not present

## 2017-07-27 DIAGNOSIS — Z23 Encounter for immunization: Secondary | ICD-10-CM | POA: Diagnosis not present

## 2017-07-27 DIAGNOSIS — R87610 Atypical squamous cells of undetermined significance on cytologic smear of cervix (ASC-US): Secondary | ICD-10-CM | POA: Diagnosis not present

## 2017-07-27 DIAGNOSIS — Z3682 Encounter for antenatal screening for nuchal translucency: Secondary | ICD-10-CM

## 2017-07-27 LAB — OB RESULTS CONSOLE ABO/RH: RH TYPE: POSITIVE

## 2017-07-27 LAB — OB RESULTS CONSOLE RUBELLA ANTIBODY, IGM: Rubella: IMMUNE

## 2017-07-27 LAB — OB RESULTS CONSOLE ANTIBODY SCREEN: ANTIBODY SCREEN: NEGATIVE

## 2017-07-27 LAB — OB RESULTS CONSOLE GC/CHLAMYDIA
CHLAMYDIA, DNA PROBE: NEGATIVE
Gonorrhea: NEGATIVE

## 2017-07-27 LAB — OB RESULTS CONSOLE RPR: RPR: NONREACTIVE

## 2017-07-27 LAB — OB RESULTS CONSOLE HEPATITIS B SURFACE ANTIGEN: Hepatitis B Surface Ag: NEGATIVE

## 2017-07-27 LAB — OB RESULTS CONSOLE HIV ANTIBODY (ROUTINE TESTING): HIV: NONREACTIVE

## 2017-08-01 NOTE — L&D Delivery Note (Addendum)
Delivery Note Called to the room after patient reports the urge to push. Complete dilation by RN exam. Exam by CNM patient was found to be anterior lip; unable to reduce with pushing. Completely dilated after 5 minutes. Involuntarily pushing with CNM at bedside.  At 2:07 AM a viable female was delivered via Vaginal, Spontaneous (Presentation: OA).  APGAR: 8, 9; weight - pending.   Constant traction applied to cord while awaiting spontaneous delivery of placenta. Voluntary pushing efforts made with no lengthening of the cord. After several minutes, pitocin bolus stopped and internal exploration of uterine cavity to find placenta firmly placed in anterior portion of uterus. CNM unable to loosen any edges of placenta. Call to Dr. Glo Herring for assistance at 20 minutes post delivery.  Placenta status: intact by manual extraction at 0236 by Dr. Glo Herring, via Delena Bali.  Cord: 3VC with no complications.  Cord pH: n/a  Anesthesia: Fentanyl 100 mcg by IV Episiotomy: None Lacerations: 1st degree vaginal Suture Repair: 2.0 vicryl Est. Blood Loss (mL):  100  Mom to postpartum.  Baby to Couplet care / Skin to Skin.  Laury Deep, MSN, CNM 02/19/18, 2:46 AM  Placenta able to be manually removed, apparently intact on inspection , by me by grasping edge of placenta, having pt valsalva, and gradually steering edge thru cervix. Membranes intact.

## 2017-08-04 ENCOUNTER — Encounter (HOSPITAL_COMMUNITY): Payer: Self-pay | Admitting: *Deleted

## 2017-08-07 ENCOUNTER — Encounter (HOSPITAL_COMMUNITY): Payer: Self-pay

## 2017-08-07 ENCOUNTER — Other Ambulatory Visit: Payer: Self-pay

## 2017-08-07 ENCOUNTER — Ambulatory Visit (HOSPITAL_COMMUNITY)
Admission: RE | Admit: 2017-08-07 | Discharge: 2017-08-07 | Disposition: A | Discharge status: Home / Self Care | Payer: BLUE CROSS/BLUE SHIELD | Source: Ambulatory Visit | Attending: Nurse Practitioner | Admitting: Nurse Practitioner

## 2017-08-07 DIAGNOSIS — Z3A13 13 weeks gestation of pregnancy: Secondary | ICD-10-CM

## 2017-08-07 DIAGNOSIS — Z3682 Encounter for antenatal screening for nuchal translucency: Secondary | ICD-10-CM | POA: Diagnosis not present

## 2017-08-07 DIAGNOSIS — O99211 Obesity complicating pregnancy, first trimester: Secondary | ICD-10-CM | POA: Diagnosis not present

## 2017-08-07 DIAGNOSIS — Z6834 Body mass index (BMI) 34.0-34.9, adult: Secondary | ICD-10-CM | POA: Insufficient documentation

## 2017-08-07 DIAGNOSIS — O3410 Maternal care for benign tumor of corpus uteri, unspecified trimester: Secondary | ICD-10-CM | POA: Insufficient documentation

## 2017-08-07 DIAGNOSIS — Z3A33 33 weeks gestation of pregnancy: Secondary | ICD-10-CM | POA: Diagnosis not present

## 2017-08-07 DIAGNOSIS — O281 Abnormal biochemical finding on antenatal screening of mother: Secondary | ICD-10-CM | POA: Diagnosis not present

## 2017-08-07 HISTORY — DX: Anemia, unspecified: D64.9

## 2017-08-07 HISTORY — DX: Scoliosis, unspecified: M41.9

## 2017-08-24 DIAGNOSIS — O281 Abnormal biochemical finding on antenatal screening of mother: Secondary | ICD-10-CM | POA: Diagnosis not present

## 2017-08-24 DIAGNOSIS — D573 Sickle-cell trait: Secondary | ICD-10-CM | POA: Diagnosis not present

## 2017-08-24 DIAGNOSIS — Z3482 Encounter for supervision of other normal pregnancy, second trimester: Secondary | ICD-10-CM | POA: Diagnosis not present

## 2017-08-24 DIAGNOSIS — Z3A15 15 weeks gestation of pregnancy: Secondary | ICD-10-CM | POA: Diagnosis not present

## 2017-08-28 ENCOUNTER — Other Ambulatory Visit: Payer: Self-pay

## 2017-08-28 ENCOUNTER — Encounter (HOSPITAL_COMMUNITY): Payer: Self-pay | Admitting: *Deleted

## 2017-08-28 ENCOUNTER — Inpatient Hospital Stay (HOSPITAL_COMMUNITY)
Admission: AD | Admit: 2017-08-28 | Discharge: 2017-08-28 | Disposition: A | Discharge status: Home / Self Care | Payer: BLUE CROSS/BLUE SHIELD | Source: Ambulatory Visit | Attending: Obstetrics and Gynecology | Admitting: Obstetrics and Gynecology

## 2017-08-28 DIAGNOSIS — O23592 Infection of other part of genital tract in pregnancy, second trimester: Secondary | ICD-10-CM | POA: Insufficient documentation

## 2017-08-28 DIAGNOSIS — Z885 Allergy status to narcotic agent status: Secondary | ICD-10-CM | POA: Diagnosis not present

## 2017-08-28 DIAGNOSIS — R1030 Lower abdominal pain, unspecified: Secondary | ICD-10-CM | POA: Insufficient documentation

## 2017-08-28 DIAGNOSIS — Z3A16 16 weeks gestation of pregnancy: Secondary | ICD-10-CM | POA: Insufficient documentation

## 2017-08-28 DIAGNOSIS — D259 Leiomyoma of uterus, unspecified: Secondary | ICD-10-CM

## 2017-08-28 DIAGNOSIS — B9689 Other specified bacterial agents as the cause of diseases classified elsewhere: Secondary | ICD-10-CM | POA: Diagnosis not present

## 2017-08-28 DIAGNOSIS — Z87891 Personal history of nicotine dependence: Secondary | ICD-10-CM | POA: Insufficient documentation

## 2017-08-28 DIAGNOSIS — O3412 Maternal care for benign tumor of corpus uteri, second trimester: Secondary | ICD-10-CM

## 2017-08-28 HISTORY — DX: Sickle-cell trait: D57.3

## 2017-08-28 LAB — CBC
HEMATOCRIT: 32.8 % — AB (ref 36.0–46.0)
HEMOGLOBIN: 11.3 g/dL — AB (ref 12.0–15.0)
MCH: 29 pg (ref 26.0–34.0)
MCHC: 34.5 g/dL (ref 30.0–36.0)
MCV: 84.3 fL (ref 78.0–100.0)
Platelets: 227 10*3/uL (ref 150–400)
RBC: 3.89 MIL/uL (ref 3.87–5.11)
RDW: 13.7 % (ref 11.5–15.5)
WBC: 9.4 10*3/uL (ref 4.0–10.5)

## 2017-08-28 LAB — WET PREP, GENITAL
Sperm: NONE SEEN
Trich, Wet Prep: NONE SEEN
YEAST WET PREP: NONE SEEN

## 2017-08-28 LAB — URINALYSIS, ROUTINE W REFLEX MICROSCOPIC
Bilirubin Urine: NEGATIVE
Glucose, UA: NEGATIVE mg/dL
HGB URINE DIPSTICK: NEGATIVE
Ketones, ur: 5 mg/dL — AB
Leukocytes, UA: NEGATIVE
NITRITE: NEGATIVE
Protein, ur: NEGATIVE mg/dL
SPECIFIC GRAVITY, URINE: 1.014 (ref 1.005–1.030)
pH: 5 (ref 5.0–8.0)

## 2017-08-28 LAB — COMPREHENSIVE METABOLIC PANEL
ALK PHOS: 52 U/L (ref 38–126)
ALT: 16 U/L (ref 14–54)
AST: 17 U/L (ref 15–41)
Albumin: 3.2 g/dL — ABNORMAL LOW (ref 3.5–5.0)
Anion gap: 9 (ref 5–15)
BILIRUBIN TOTAL: 0.3 mg/dL (ref 0.3–1.2)
CO2: 21 mmol/L — ABNORMAL LOW (ref 22–32)
CREATININE: 0.53 mg/dL (ref 0.44–1.00)
Calcium: 8.7 mg/dL — ABNORMAL LOW (ref 8.9–10.3)
Chloride: 104 mmol/L (ref 101–111)
GFR calc Af Amer: >60 mL/min (ref >60)
GFR calc non Af Amer: >60 mL/min (ref >60)
Glucose, Bld: 84 mg/dL (ref 65–99)
Potassium: 3.4 mmol/L — ABNORMAL LOW (ref 3.5–5.1)
Sodium: 134 mmol/L — ABNORMAL LOW (ref 135–145)
TOTAL PROTEIN: 6.8 g/dL (ref 6.5–8.1)

## 2017-08-28 MED ORDER — METRONIDAZOLE 0.75 % VA GEL: 1.0000 | Freq: Every day | VAGINAL | 0 refills | Status: AC

## 2017-08-28 MED ORDER — INDOMETHACIN 25 MG PO CAPS: 25.0000 mg | ORAL_CAPSULE | Freq: Three times a day (TID) | ORAL | 0 refills | Status: AC

## 2017-08-28 NOTE — MAU Provider Note (Signed)
History     CSN: [id number]  Arrival date and time: 08/28/17 1334   First Provider Initiated Contact with Patient 08/28/17 1443      Chief Complaint  Patient presents with  . Back Pain  . Pelvic Pain   HPI Anna Beck 32 y.o. [redacted]w[redacted]d  Comes to MAU with lower abdominal pain which radiates to her back for the past 2 days that is constant.  Different from cramping with menses.  No vaginal bleeding, no vaginal discharge, no vomiting or diarrhea.  Has tried ice pack and a heating pad to her back.  Has tried using Tylenol but has not gotten relief.  [facility]   OB History    Gravida Para Term Preterm AB Living   4 1 1   2 1    SAB TAB Ectopic Multiple Live Births   1 1            Past Medical History:  Diagnosis Date  . Anemia   . Scoliosis   . Sickle cell trait [facility])     Past Surgical History:  Procedure Laterality Date  . CHOLECYSTECTOMY    . DG SCOLIOSIS STUDY ENTIRE SPINE ([place] HX)     States corrective surgery of scoliosis  . ROOT CANAL      History reviewed. No pertinent family history.  Social History   Tobacco Use  . Smoking status: Former Research scientist (life sciences)  . Smokeless tobacco: Never Used  Substance Use Topics  . Alcohol use: Yes    Comment: Twice a month  . Drug use: No    Allergies:  Allergies  Allergen Reactions  . Percocet [Oxycodone-Acetaminophen] Itching    Medications Prior to Admission  Medication Sig Dispense Refill Last Dose  . diclofenac (VOLTAREN) 75 MG EC tablet Take 1 tablet (75 mg total) by mouth 2 (two) times daily. (Patient not taking: Reported on 08/07/2017) 14 tablet 0 Not Taking  . HYDROcodone-acetaminophen (NORCO/VICODIN) 5-325 MG tablet Take 2 tablets by mouth every 4 (four) hours as needed. (Patient not taking: Reported on 08/07/2017) 10 tablet 0 Not Taking  . methocarbamol (ROBAXIN) 500 MG tablet Take 1 tablet (500 mg total) by mouth 2 (two) times daily. (Patient not taking: Reported on  08/07/2017) 20 tablet 0 Not Taking  . Metoclopramide HCl (REGLAN PO) Take by mouth.   Taking  . naproxen sodium (ALEVE) 220 MG tablet Take 440 mg by mouth 2 (two) times daily as needed (pain).   Not Taking  . Prenatal Vit-Fe Fumarate-FA (PRENATAL VITAMIN PO) Take by mouth.   Taking    Review of Systems  Constitutional: Negative for fever.  Gastrointestinal: Positive for abdominal pain. Negative for diarrhea, nausea and vomiting.  Genitourinary: Negative for dysuria, vaginal bleeding and vaginal discharge.  Musculoskeletal: Positive for back pain.   Physical Exam   Blood pressure 105/63, pulse 75, temperature 98.6 F (37 C), temperature source Oral, resp. rate 18, weight 212 lb 12 oz (96.5 kg), last menstrual period 05/08/2017, SpO2 100 %.  Physical Exam  Nursing note and vitals reviewed. Constitutional: She is oriented to person, place, and time. She appears well-developed and well-nourished.  HENT:  Head: Normocephalic.  Eyes: EOM are normal.  Neck: Neck supple.  Cardiovascular: Normal rate and regular rhythm.  Respiratory: Effort normal.  GI: Soft. Bowel sounds are normal. She exhibits no distension. There is no tenderness. There is no rebound and no guarding.  Able to palpate abdomen.  Can feel fundus and uterus  feels irregular in shape.  Genitourinary:  Genitourinary Comments: Speculum exam: Vagina - Mod amount of creamy discharge, no odor Cervix - No contact bleeding, appears closed Bimanual exam: Cervix closed and thick Uterus tender, unable to size due to discomfort Adnexa - no masses bilaterally but generalized discomfort wet prep done Chaperone present for exam.   Musculoskeletal: Normal range of motion.  Neurological: She is alert and oriented to person, place, and time.  Skin: Skin is warm and dry.  Psychiatric: She has a normal mood and affect.    MAU Course  Procedures  MDM Fetal translucency done on 08-07-17 - report reviewed.  Uterine fibroid identified on  the anterior right at 3.8x4x3.8 cm.  Client was unaware she had a fibroid.  Consult with Dr. Elly Modena - likely the uterine fibroid may be painful due to degeneration.  This will likely resolve in time.  Will prescribe a short course of indomethacin to see if pain resolves and if so, the pain is likely from the fibroid.  Assessment and Plan  Uterine fibroid in pregnancy at 16 weeks Abdominal pain and back pain likely from fibroid but not definitive Bacterial vaginosis - did not discuss with client while in MAU - called client to tell her but there was no answer and her voicemail was full; sent prescription for Metrogel to her pharmacy.  Plan Prescribed a 3 day course of indomethacin - take with food to avoid stomach pain Prescribed Metrogel vaginal cream to treat the BV.  Use vaginally for 5 days. Client to keep all of her appointments in the clinic. To return to clinic or MAU if pain is worsening or if she develops vaginal bleeding. Client in agreement with this plan.  Terri L Burleson 08/28/2017, 4:21 PM

## 2017-08-28 NOTE — Progress Notes (Signed)
Discharge instructions given with pt understanding. Pt left unit via ambulatory

## 2017-08-28 NOTE — MAU Note (Signed)
Having real bad pelvic pain and back pain. Started yesterday, is constant

## 2017-08-28 NOTE — Discharge Instructions (Signed)
Get your medication from the pharmacy.  Take 3 times a day - with food preferably. [facility] if your pain does not resolve.

## 2017-09-18 DIAGNOSIS — Z3482 Encounter for supervision of other normal pregnancy, second trimester: Secondary | ICD-10-CM | POA: Diagnosis not present

## 2017-10-16 DIAGNOSIS — Z3482 Encounter for supervision of other normal pregnancy, second trimester: Secondary | ICD-10-CM | POA: Diagnosis not present

## 2017-10-20 ENCOUNTER — Encounter (HOSPITAL_COMMUNITY): Payer: Self-pay

## 2017-10-20 ENCOUNTER — Inpatient Hospital Stay (HOSPITAL_COMMUNITY)
Admission: AD | Admit: 2017-10-20 | Discharge: 2017-10-20 | Disposition: A | Discharge status: Home / Self Care | Payer: BLUE CROSS/BLUE SHIELD | Source: Ambulatory Visit | Attending: Obstetrics and Gynecology | Admitting: Obstetrics and Gynecology

## 2017-10-20 ENCOUNTER — Other Ambulatory Visit: Payer: Self-pay

## 2017-10-20 DIAGNOSIS — Z9889 Other specified postprocedural states: Secondary | ICD-10-CM | POA: Insufficient documentation

## 2017-10-20 DIAGNOSIS — Z9049 Acquired absence of other specified parts of digestive tract: Secondary | ICD-10-CM | POA: Insufficient documentation

## 2017-10-20 DIAGNOSIS — Z87891 Personal history of nicotine dependence: Secondary | ICD-10-CM | POA: Diagnosis not present

## 2017-10-20 DIAGNOSIS — D571 Sickle-cell disease without crisis: Secondary | ICD-10-CM | POA: Insufficient documentation

## 2017-10-20 DIAGNOSIS — Z885 Allergy status to narcotic agent status: Secondary | ICD-10-CM | POA: Insufficient documentation

## 2017-10-20 DIAGNOSIS — O4702 False labor before 37 completed weeks of gestation, second trimester: Secondary | ICD-10-CM

## 2017-10-20 DIAGNOSIS — Z3A23 23 weeks gestation of pregnancy: Secondary | ICD-10-CM | POA: Insufficient documentation

## 2017-10-20 DIAGNOSIS — R519 Headache, unspecified: Secondary | ICD-10-CM

## 2017-10-20 DIAGNOSIS — O99012 Anemia complicating pregnancy, second trimester: Secondary | ICD-10-CM | POA: Diagnosis not present

## 2017-10-20 DIAGNOSIS — R51 Headache: Secondary | ICD-10-CM

## 2017-10-20 DIAGNOSIS — R109 Unspecified abdominal pain: Secondary | ICD-10-CM | POA: Diagnosis not present

## 2017-10-20 LAB — URINALYSIS, ROUTINE W REFLEX MICROSCOPIC
BILIRUBIN URINE: NEGATIVE
GLUCOSE, UA: NEGATIVE mg/dL
Hgb urine dipstick: NEGATIVE
KETONES UR: NEGATIVE mg/dL
Leukocytes, UA: NEGATIVE
NITRITE: NEGATIVE
PH: 6 (ref 5.0–8.0)
Protein, ur: NEGATIVE mg/dL
Specific Gravity, Urine: 1.012 (ref 1.005–1.030)

## 2017-10-20 LAB — WET PREP, GENITAL
Clue Cells Wet Prep HPF POC: NONE SEEN
Sperm: NONE SEEN
Trich, Wet Prep: NONE SEEN
Yeast Wet Prep HPF POC: NONE SEEN

## 2017-10-20 LAB — FETAL FIBRONECTIN: FETAL FIBRONECTIN: NEGATIVE

## 2017-10-20 MED ORDER — LACTATED RINGERS IV SOLN
INTRAVENOUS | Status: DC
  Administered 2017-10-20: 15:00:00 via INTRAVENOUS

## 2017-10-20 MED ORDER — ACETAMINOPHEN 500 MG PO TABS
1000.0000 mg | ORAL_TABLET | Freq: Once | ORAL | Status: AC
  Administered 2017-10-20: 1000 mg via ORAL
  Filled 2017-10-20: qty 2

## 2017-10-20 MED ORDER — NIFEDIPINE 10 MG PO CAPS
10.0000 mg | ORAL_CAPSULE | ORAL | Status: AC | PRN
  Administered 2017-10-20 (×3): 10 mg via ORAL
  Filled 2017-10-20 (×3): qty 1

## 2017-10-20 NOTE — Discharge Instructions (Signed)
Drink at least 8 8-oz glasses of water every day. Take Tylenol 325 mg 2 tablets by mouth every 4 hours if needed for pain. Return if you start having regular contractions again. Keep your appointments in the office.

## 2017-10-20 NOTE — MAU Note (Signed)
Pt states she started cramping last night around 9:30 pm. Could not sleep due to cramping and states this morning it feels more like contractions.

## 2017-10-20 NOTE — MAU Provider Note (Signed)
History     CSN: [id number]  Arrival date and time: 10/20/17 1333   First Provider Initiated Contact with Patient 10/20/17 1402      Chief Complaint  Patient presents with  . Contractions   HPI Anna Beck 32 y.o. [redacted]w[redacted]d  Comes to MAU from work today with contractions that she has been feeling every 4-5 minutes.  No intercourse in the past 24 hours.  Client has known fibroids but has been doing well.  One prior admission in this pregnancy and used a course of indomethacin for the fibroid pain and it resolved.  We feeling this pain (different from the fibroid pain) some last night but it was worse at work today.  Denies any vaginal bleeding or leaking of fluid.  [facility] and was seen there last on Monday.  OB History    Gravida  4   Para  1   Term  1   Preterm      AB  2   Living  1     SAB  1   TAB  1   Ectopic      Multiple      Live Births              Past Medical History:  Diagnosis Date  . Anemia   . Scoliosis   . Sickle cell trait [facility])     Past Surgical History:  Procedure Laterality Date  . CHOLECYSTECTOMY    . DG SCOLIOSIS STUDY ENTIRE SPINE ([place] HX)     States corrective surgery of scoliosis  . ROOT CANAL      History reviewed. No pertinent family history.  Social History   Tobacco Use  . Smoking status: Former Research scientist (life sciences)  . Smokeless tobacco: Never Used  Substance Use Topics  . Alcohol use: Yes    Comment: Twice a month  . Drug use: No    Allergies:  Allergies  Allergen Reactions  . Percocet [Oxycodone-Acetaminophen] Itching    Medications Prior to Admission  Medication Sig Dispense Refill Last Dose  . Metoclopramide HCl (REGLAN PO) Take by mouth.   Taking  . Prenatal Vit-Fe Fumarate-FA (PRENATAL VITAMIN PO) Take by mouth.   Taking    Review of Systems  Constitutional: Negative for fever.  Gastrointestinal: Positive for abdominal pain. Negative for constipation, diarrhea, nausea and  vomiting.  Genitourinary: Negative for dysuria, vaginal bleeding and vaginal discharge.   Physical Exam   Blood pressure 109/63, pulse 82, temperature 98.2 F (36.8 C), temperature source Oral, resp. rate 16, height 5\' 7"  (1.702 m), weight 215 lb 9 oz (97.8 kg), last menstrual period 05/08/2017, SpO2 100 %.  Physical Exam  Nursing note and vitals reviewed. Constitutional: She is oriented to person, place, and time. She appears well-developed and well-nourished.  HENT:  Head: Normocephalic.  Eyes: EOM are normal.  Neck: Neck supple.  GI: Soft. There is tenderness. There is no rebound and no guarding.  FHT baseline on monitor is 140 with moderate variability which is consistent for gestational age.  Contractions are 3-4 minutes and client is aware of the contractions.  No decelerations seen.  Baby is difficult to trace at 23 weeks.  Genitourinary:  Genitourinary Comments: Speculum exam: Vagina - Small amount of creamy, bubbly  discharge, no odor Cervix - No contact bleeding Bimanual exam: Cervix closed, and thick, soft - no presenting part in the pelvis GC/Chlam, wet prep, GBS and FFN done Chaperone present for exam.  Musculoskeletal: Normal range of motion.  Neurological: She is alert and oriented to person, place, and time.  Skin: Skin is warm and dry.  Psychiatric: She has a normal mood and affect.    MAU Course  Procedures Results for orders placed or performed during the hospital encounter of 10/20/17 (from the past 24 hour(s))  Urinalysis, Routine w reflex microscopic     Status: None   Collection Time: 10/20/17  1:45 PM  Result Value Ref Range   Color, Urine YELLOW YELLOW   APPearance CLEAR CLEAR   Specific Gravity, Urine 1.012 1.005 - 1.030   pH 6.0 5.0 - 8.0   Glucose, UA NEGATIVE NEGATIVE mg/dL   Hgb urine dipstick NEGATIVE NEGATIVE   Bilirubin Urine NEGATIVE NEGATIVE   Ketones, ur NEGATIVE NEGATIVE mg/dL   Protein, ur NEGATIVE NEGATIVE mg/dL   Nitrite NEGATIVE  NEGATIVE   Leukocytes, UA NEGATIVE NEGATIVE  Wet prep, genital     Status: Abnormal   Collection Time: 10/20/17  2:30 PM  Result Value Ref Range   Yeast Wet Prep HPF POC NONE SEEN NONE SEEN   Trich, Wet Prep NONE SEEN NONE SEEN   Clue Cells Wet Prep HPF POC NONE SEEN NONE SEEN   WBC, Wet Prep HPF POC FEW (A) NONE SEEN   Sperm NONE SEEN   Fetal fibronectin     Status: None   Collection Time: 10/20/17  2:30 PM  Result Value Ref Range   Fetal Fibronectin NEGATIVE NEGATIVE    MDM Client is having contractions so will start IVF to treat preterm contractions.  Contractions continuing despite infusion of 700 cc of IVF.  Consult with Dr. Elly Modena.  Will give Procardia by protocol for contractions.  After 3 doses of Procardia, contractions stopped and then client had a headache.  Tylenol 1000mg  given and after rest, contractions are still stopped, headache is relieved and on cervical recheck, cervix is still the same.  No cervical change during visit.  Will discharge.  Assessment and Plan  Threatened preterm labor stopped with IVF and 3 doses of Procardia Headache relieved with Tylenol Reassuring NST - appropriate for gestational age  Plan Take Tylenol 325 mg 2 tablets by mouth every 4 hours if needed for pain. Drink at least 8 8-oz glasses of water every day. Keep your appointments in the office. Return if you develop vaginal bleeding, vaginal leaking or contractions begin again. Pelvic rest for today and tomorrow.  Storm Sovine L Giovoni Bunch 10/20/2017, 2:28 PM

## 2017-10-22 ENCOUNTER — Inpatient Hospital Stay (HOSPITAL_COMMUNITY): Payer: BLUE CROSS/BLUE SHIELD

## 2017-10-22 ENCOUNTER — Inpatient Hospital Stay (HOSPITAL_COMMUNITY)
Admission: AD | Admit: 2017-10-22 | Discharge: 2017-10-22 | Disposition: A | Discharge status: Home / Self Care | Payer: BLUE CROSS/BLUE SHIELD | Source: Ambulatory Visit | Attending: Obstetrics and Gynecology | Admitting: Obstetrics and Gynecology

## 2017-10-22 ENCOUNTER — Encounter (HOSPITAL_COMMUNITY): Payer: Self-pay

## 2017-10-22 DIAGNOSIS — O368390 Maternal care for abnormalities of the fetal heart rate or rhythm, unspecified trimester, not applicable or unspecified: Secondary | ICD-10-CM

## 2017-10-22 DIAGNOSIS — O4700 False labor before 37 completed weeks of gestation, unspecified trimester: Secondary | ICD-10-CM

## 2017-10-22 DIAGNOSIS — Z3A23 23 weeks gestation of pregnancy: Secondary | ICD-10-CM | POA: Diagnosis not present

## 2017-10-22 DIAGNOSIS — O368320 Maternal care for abnormalities of the fetal heart rate or rhythm, second trimester, not applicable or unspecified: Secondary | ICD-10-CM | POA: Diagnosis not present

## 2017-10-22 DIAGNOSIS — O4702 False labor before 37 completed weeks of gestation, second trimester: Secondary | ICD-10-CM | POA: Diagnosis not present

## 2017-10-22 DIAGNOSIS — O479 False labor, unspecified: Secondary | ICD-10-CM | POA: Diagnosis not present

## 2017-10-22 HISTORY — DX: Leiomyoma of uterus, unspecified: D25.9

## 2017-10-22 LAB — CBC WITH DIFFERENTIAL/PLATELET
BASOS ABS: 0 10*3/uL (ref 0.0–0.1)
BASOS PCT: 0 %
EOS ABS: 0 10*3/uL (ref 0.0–0.7)
EOS PCT: 0 %
HCT: 34.6 % — ABNORMAL LOW (ref 36.0–46.0)
Hemoglobin: 12 g/dL (ref 12.0–15.0)
Lymphocytes Relative: 13 %
Lymphs Abs: 1.6 10*3/uL (ref 0.7–4.0)
MCH: 30.2 pg (ref 26.0–34.0)
MCHC: 34.7 g/dL (ref 30.0–36.0)
MCV: 87.2 fL (ref 78.0–100.0)
MONO ABS: 0.5 10*3/uL (ref 0.1–1.0)
MONOS PCT: 4 %
Neutro Abs: 10.3 10*3/uL — ABNORMAL HIGH (ref 1.7–7.7)
Neutrophils Relative %: 83 %
PLATELETS: 238 10*3/uL (ref 150–400)
RBC: 3.97 MIL/uL (ref 3.87–5.11)
RDW: 13.8 % (ref 11.5–15.5)
WBC: 12.5 10*3/uL — ABNORMAL HIGH (ref 4.0–10.5)

## 2017-10-22 LAB — URINALYSIS, MICROSCOPIC (REFLEX)
BACTERIA UA: NONE SEEN
WBC UA: NONE SEEN WBC/hpf (ref 0–5)

## 2017-10-22 LAB — URINALYSIS, ROUTINE W REFLEX MICROSCOPIC
BILIRUBIN URINE: NEGATIVE
GLUCOSE, UA: NEGATIVE mg/dL
LEUKOCYTES UA: NEGATIVE
Nitrite: NEGATIVE
PH: 6 (ref 5.0–8.0)
PROTEIN: NEGATIVE mg/dL
Specific Gravity, Urine: 1.02 (ref 1.005–1.030)

## 2017-10-22 MED ORDER — NIFEDIPINE 10 MG PO CAPS
10.0000 mg | ORAL_CAPSULE | ORAL | Status: DC | PRN
  Administered 2017-10-22 (×3): 10 mg via ORAL
  Filled 2017-10-22 (×3): qty 1

## 2017-10-22 MED ORDER — NIFEDIPINE 10 MG PO CAPS: 10.0000 mg | ORAL_CAPSULE | Freq: Four times a day (QID) | ORAL | 0 refills | Status: DC | PRN

## 2017-10-22 MED ORDER — IBUPROFEN 600 MG PO TABS: 600.0000 mg | ORAL_TABLET | Freq: Four times a day (QID) | ORAL | 1 refills | Status: DC | PRN

## 2017-10-22 NOTE — MAU Note (Signed)
Having ctx every 3-4 min. Was here on Friday with same thing, was given procardia and that stopped the ctx for a few hours. No LOF, no vag bleeding, no vag discharge. + FM

## 2017-10-22 NOTE — MAU Provider Note (Signed)
CC:  Chief Complaint  Patient presents with  . Contractions   First Provider Initiated Contact with Patient 10/22/17 1138     HPI: Anna Beck is a 32 y.o. year old [id number] female at [redacted]w[redacted]d weeks gestation who presents to MAU reporting contractions every 2-3 minutes since 10/19/17. Was seen in MAU for this. Cervix closed and fFN neg. Received Procardia->good relief of contractions. Also having discomfort that she attributes to a fibroid. States this pain is different.   Associated Sx:  Vaginal bleeding: Denies Leaking of fluid: Denies Fetal movement: Nml  O:  Patient Vitals for the past 24 hrs:  BP Temp Temp src Pulse Resp Height Weight  10/22/17 1035 109/70 98.4 F (36.9 C) Oral 90 18 5\' 7"  (1.702 m) 213 lb (96.6 kg)    General: NAD Heart: Regular rate Lungs: Normal rate and effort Abd: Soft, NT, Gravid, S=D Pelvic: NEFG, Neg pooling, Neg  blood.     EFM: 145, Moderate variability, 15 x 15 accelerations, variable decelerations, resolved spontaneously  Toco: UI  Orders Placed This Encounter  Procedures  . [place] MFM OB Limited  . [place] MFM OB Transvaginal  . Urinalysis, Routine w reflex microscopic  . CBC with Differential/Platelet  . Urinalysis, Microscopic (reflex)  . Discharge patient   Meds ordered this encounter  Medications  . DISCONTD: NIFEdipine (PROCARDIA) capsule 10 mg  . NIFEdipine (PROCARDIA) 10 MG capsule    Sig: Take 1 capsule (10 mg total) by mouth every 6 (six) hours as needed (for greater than 5 contractions per hour).    Dispense:  30 capsule    Refill:  0    Order Specific Question:   Supervising Provider    Answer:   Sloan Leiter [[id number]]  . ibuprofen (ADVIL,MOTRIN) 600 MG tablet    Sig: Take 1 tablet (600 mg total) by mouth every 6 (six) hours as needed. Do not use after [redacted] weeks gestation.    Dispense:  30 tablet    Refill:  1    Order Specific Question:   Supervising Provider    Answer:   Sloan Leiter [[id number]]   Contractions resolved w/  Procardia. Feeling much better, but still having fibroid pain. May take IBU sparingly.   Imaging CL 4.39 cm, Nml fluid.   A: [redacted]w[redacted]d week IUP Preterm contractions w/ out evidence of active PTL.  FHR reactive Variable decels, resolved--reassuring for gestational age.   P: Discharge home in stable condition. Preterm labor precautions and fetal kick counts. Follow-up as scheduled for prenatal visit or sooner as needed if symptoms worsen. Return to maternity admissions as needed if symptoms worsen. Rx Procardia PRN.  Push fluids.  IBU sparingly. Do not use after 28 weeks.   Tamala Julian, [place], [place] 10/22/2017 11:17 AM  3

## 2017-10-22 NOTE — Progress Notes (Addendum)
G4P1 @ 23.[redacted] wksga. Here dt ctx that started on Friday. States was on procardia and ctx stopped but was not prescribed for home use so pt is back for the same issue.  Denies LOF or bleeding. +FM noted and voiced.   Provider at bs.   To U/S  1300: Lab at bs  1309: 3rd dose procardia given. Vss. See flow sheet for details. Transducer U/s adjusted.   1358: D/c instructions given with pt understanding. Pt left unit via ambulatory.

## 2017-10-22 NOTE — Discharge Instructions (Signed)
Preterm Labor and Birth Information The normal length of a pregnancy is 39-41 weeks. Preterm labor is when labor starts before 37 completed weeks of pregnancy. What are the risk factors for preterm labor? Preterm labor is more likely to occur in women who:  Have certain infections during pregnancy such as a bladder infection, sexually transmitted infection, or infection inside the uterus (chorioamnionitis).  Have a shorter-than-normal cervix.  Have gone into preterm labor before.  Have had surgery on their cervix.  Are younger than age 89 or older than age 19.  Are African American.  Are pregnant with twins or multiple babies (multiple gestation).  Take street drugs or smoke while pregnant.  Do not gain enough weight while pregnant.  Became pregnant shortly after having been pregnant.  What are the symptoms of preterm labor? Symptoms of preterm labor include:  Cramps similar to those that can happen during a menstrual period. The cramps may happen with diarrhea.  Pain in the abdomen or lower back.  Regular uterine contractions that may feel like tightening of the abdomen.  A feeling of increased pressure in the pelvis.  Increased watery or bloody mucus discharge from the vagina.  Water breaking (ruptured amniotic sac).  Why is it important to recognize signs of preterm labor? It is important to recognize signs of preterm labor because babies who are born prematurely may not be fully developed. This can put them at an increased risk for:  Long-term (chronic) heart and lung problems.  Difficulty immediately after birth with regulating body systems, including blood sugar, body temperature, heart rate, and breathing rate.  Bleeding in the brain.  Cerebral palsy.  Learning difficulties.  Death.  These risks are highest for babies who are born before 37 weeks of pregnancy. How is preterm labor treated? Treatment depends on the length of your pregnancy, your  condition, and the health of your baby. It may involve:  Having a stitch (suture) placed in your cervix to prevent your cervix from opening too early (cerclage).  Taking or being given medicines, such as: ? Hormone medicines. These may be given early in pregnancy to help support the pregnancy. ? Medicine to stop contractions. ? Medicines to help mature the babys lungs. These may be prescribed if the risk of delivery is high. ? Medicines to prevent your baby from developing cerebral palsy.  If the labor happens before 34 weeks of pregnancy, you may need to stay in the hospital. What should I do if I think I am in preterm labor? If you think that you are going into preterm labor, call your health care provider right away. How can I prevent preterm labor in future pregnancies? To increase your chance of having a full-term pregnancy:  Do not use any tobacco products, such as cigarettes, chewing tobacco, and e-cigarettes. If you need help quitting, ask your health care provider.  Do not use street drugs or medicines that have not been prescribed to you during your pregnancy.  Talk with your health care provider before taking any herbal supplements, even if you have been taking them regularly.  Make sure you gain a healthy amount of weight during your pregnancy.  Watch for infection. If you think that you might have an infection, get it checked right away.  Make sure to tell your health care provider if you have gone into preterm labor before.  This information is not intended to replace advice given to you by your health care provider. Make sure you discuss any questions  think that you might have an infection, get it checked right away.   Make sure to tell your health care provider if you have gone into preterm labor before.    This information is not intended to replace advice given to you by your health care provider. Make sure you discuss any questions you have with your health care provider.  Document Released: 10/08/2003 Document Revised: 12/29/2015 Document Reviewed: 12/09/2015  Elsevier Interactive Patient Education  2018 [employer]vier [employer]

## 2017-10-23 LAB — CULTURE, BETA STREP (GROUP B ONLY)

## 2017-10-23 LAB — GC/CHLAMYDIA PROBE AMP (~~LOC~~) NOT AT ARMC
Chlamydia: NEGATIVE
NEISSERIA GONORRHEA: NEGATIVE

## 2017-11-16 DIAGNOSIS — D259 Leiomyoma of uterus, unspecified: Secondary | ICD-10-CM | POA: Diagnosis not present

## 2017-11-16 DIAGNOSIS — Z3483 Encounter for supervision of other normal pregnancy, third trimester: Secondary | ICD-10-CM | POA: Diagnosis not present

## 2017-11-16 DIAGNOSIS — R823 Hemoglobinuria: Secondary | ICD-10-CM | POA: Diagnosis not present

## 2017-11-16 DIAGNOSIS — Z3482 Encounter for supervision of other normal pregnancy, second trimester: Secondary | ICD-10-CM | POA: Diagnosis not present

## 2017-11-30 DIAGNOSIS — R824 Acetonuria: Secondary | ICD-10-CM | POA: Diagnosis not present

## 2017-11-30 DIAGNOSIS — O99019 Anemia complicating pregnancy, unspecified trimester: Secondary | ICD-10-CM | POA: Diagnosis not present

## 2017-11-30 DIAGNOSIS — Z3483 Encounter for supervision of other normal pregnancy, third trimester: Secondary | ICD-10-CM | POA: Diagnosis not present

## 2017-12-15 DIAGNOSIS — Z3483 Encounter for supervision of other normal pregnancy, third trimester: Secondary | ICD-10-CM | POA: Diagnosis not present

## 2017-12-29 DIAGNOSIS — Z3483 Encounter for supervision of other normal pregnancy, third trimester: Secondary | ICD-10-CM | POA: Diagnosis not present

## 2018-01-12 DIAGNOSIS — Z3483 Encounter for supervision of other normal pregnancy, third trimester: Secondary | ICD-10-CM | POA: Diagnosis not present

## 2018-01-12 LAB — OB RESULTS CONSOLE GC/CHLAMYDIA
Chlamydia: NEGATIVE
GC PROBE AMP, GENITAL: NEGATIVE

## 2018-01-12 LAB — OB RESULTS CONSOLE GBS: GBS: NEGATIVE

## 2018-01-19 DIAGNOSIS — Z3483 Encounter for supervision of other normal pregnancy, third trimester: Secondary | ICD-10-CM | POA: Diagnosis not present

## 2018-01-26 DIAGNOSIS — Z3483 Encounter for supervision of other normal pregnancy, third trimester: Secondary | ICD-10-CM | POA: Diagnosis not present

## 2018-02-02 DIAGNOSIS — Z3483 Encounter for supervision of other normal pregnancy, third trimester: Secondary | ICD-10-CM | POA: Diagnosis not present

## 2018-02-09 DIAGNOSIS — Z3483 Encounter for supervision of other normal pregnancy, third trimester: Secondary | ICD-10-CM | POA: Diagnosis not present

## 2018-02-12 DIAGNOSIS — O480 Post-term pregnancy: Secondary | ICD-10-CM | POA: Diagnosis not present

## 2018-02-13 ENCOUNTER — Telehealth (HOSPITAL_COMMUNITY): Payer: Self-pay | Admitting: *Deleted

## 2018-02-14 NOTE — Telephone Encounter (Signed)
Preadmission screen  

## 2018-02-15 ENCOUNTER — Telehealth (HOSPITAL_COMMUNITY): Payer: Self-pay | Admitting: *Deleted

## 2018-02-15 DIAGNOSIS — Z3483 Encounter for supervision of other normal pregnancy, third trimester: Secondary | ICD-10-CM | POA: Diagnosis not present

## 2018-02-15 DIAGNOSIS — O480 Post-term pregnancy: Secondary | ICD-10-CM | POA: Diagnosis not present

## 2018-02-15 NOTE — Telephone Encounter (Signed)
Preadmission screen  

## 2018-02-16 ENCOUNTER — Encounter (HOSPITAL_COMMUNITY): Payer: Self-pay | Admitting: *Deleted

## 2018-02-16 ENCOUNTER — Telehealth (HOSPITAL_COMMUNITY): Payer: Self-pay | Admitting: *Deleted

## 2018-02-16 NOTE — Telephone Encounter (Signed)
Preadmission screen  

## 2018-02-18 ENCOUNTER — Inpatient Hospital Stay (HOSPITAL_COMMUNITY)
Admission: AD | Admit: 2018-02-18 | Discharge: 2018-02-20 | DRG: 807 | Disposition: A | Discharge status: Home / Self Care | Payer: BLUE CROSS/BLUE SHIELD | Attending: Obstetrics and Gynecology | Admitting: Obstetrics and Gynecology

## 2018-02-18 ENCOUNTER — Other Ambulatory Visit: Payer: Self-pay

## 2018-02-18 ENCOUNTER — Encounter (HOSPITAL_COMMUNITY): Payer: Self-pay

## 2018-02-18 ENCOUNTER — Inpatient Hospital Stay (HOSPITAL_COMMUNITY)
Admission: AD | Admit: 2018-02-18 | Discharge: 2018-02-18 | Disposition: A | Discharge status: Home / Self Care | Payer: BLUE CROSS/BLUE SHIELD | Source: Ambulatory Visit | Attending: Obstetrics and Gynecology | Admitting: Obstetrics and Gynecology

## 2018-02-18 ENCOUNTER — Encounter (HOSPITAL_COMMUNITY): Payer: Self-pay | Admitting: *Deleted

## 2018-02-18 DIAGNOSIS — O471 False labor at or after 37 completed weeks of gestation: Secondary | ICD-10-CM

## 2018-02-18 DIAGNOSIS — Z3A41 41 weeks gestation of pregnancy: Secondary | ICD-10-CM | POA: Diagnosis not present

## 2018-02-18 DIAGNOSIS — O480 Post-term pregnancy: Secondary | ICD-10-CM | POA: Diagnosis not present

## 2018-02-18 DIAGNOSIS — Z3483 Encounter for supervision of other normal pregnancy, third trimester: Secondary | ICD-10-CM | POA: Diagnosis not present

## 2018-02-18 DIAGNOSIS — Z87891 Personal history of nicotine dependence: Secondary | ICD-10-CM

## 2018-02-18 DIAGNOSIS — D573 Sickle-cell trait: Secondary | ICD-10-CM | POA: Diagnosis not present

## 2018-02-18 DIAGNOSIS — O9902 Anemia complicating childbirth: Secondary | ICD-10-CM | POA: Diagnosis not present

## 2018-02-18 HISTORY — DX: Trichomoniasis, unspecified: A59.9

## 2018-02-18 LAB — CBC
HCT: 36.1 % (ref 36.0–46.0)
Hemoglobin: 12.5 g/dL (ref 12.0–15.0)
MCH: 30.6 pg (ref 26.0–34.0)
MCHC: 34.6 g/dL (ref 30.0–36.0)
MCV: 88.3 fL (ref 78.0–100.0)
PLATELETS: 249 10*3/uL (ref 150–400)
RBC: 4.09 MIL/uL (ref 3.87–5.11)
RDW: 14 % (ref 11.5–15.5)
WBC: 8.8 10*3/uL (ref 4.0–10.5)

## 2018-02-18 MED ORDER — OXYTOCIN BOLUS FROM INFUSION
500.0000 mL | Freq: Once | INTRAVENOUS | Status: AC
  Administered 2018-02-19: 500 mL via INTRAVENOUS

## 2018-02-18 MED ORDER — OXYTOCIN 40 UNITS IN LACTATED RINGERS INFUSION - SIMPLE MED
2.5000 [IU]/h | INTRAVENOUS | Status: DC
  Filled 2018-02-18: qty 1000

## 2018-02-18 MED ORDER — LACTATED RINGERS IV SOLN
500.0000 mL | INTRAVENOUS | Status: DC | PRN
  Administered 2018-02-18: 1000 mL via INTRAVENOUS

## 2018-02-18 MED ORDER — LIDOCAINE HCL (PF) 1 % IJ SOLN
30.0000 mL | INTRAMUSCULAR | Status: DC | PRN
  Administered 2018-02-19: 30 mL via SUBCUTANEOUS
  Filled 2018-02-18: qty 30

## 2018-02-18 MED ORDER — OXYCODONE-ACETAMINOPHEN 5-325 MG PO TABS: 1.0000 | ORAL_TABLET | ORAL | Status: DC | PRN

## 2018-02-18 MED ORDER — FENTANYL CITRATE (PF) 100 MCG/2ML IJ SOLN
100.0000 ug | INTRAMUSCULAR | Status: DC | PRN
  Administered 2018-02-19 (×3): 100 ug via INTRAVENOUS
  Filled 2018-02-18 (×3): qty 2

## 2018-02-18 MED ORDER — SOD CITRATE-CITRIC ACID 500-334 MG/5ML PO SOLN: 30.0000 mL | ORAL | Status: DC | PRN

## 2018-02-18 MED ORDER — ACETAMINOPHEN 325 MG PO TABS: 650.0000 mg | ORAL_TABLET | ORAL | Status: DC | PRN

## 2018-02-18 MED ORDER — OXYCODONE-ACETAMINOPHEN 5-325 MG PO TABS: 2.0000 | ORAL_TABLET | ORAL | Status: DC | PRN

## 2018-02-18 MED ORDER — OXYTOCIN 10 UNIT/ML IJ SOLN
10.0000 [IU] | Freq: Once | INTRAMUSCULAR | Status: DC | PRN
  Filled 2018-02-18: qty 1

## 2018-02-18 MED ORDER — LACTATED RINGERS IV SOLN
INTRAVENOUS | Status: DC
  Administered 2018-02-18: via INTRAVENOUS

## 2018-02-18 NOTE — MAU Note (Signed)
Contractions woke her at 0600. Were every 10 minutes when started, now every 7.  No bleeding or leaking.  No problems with pregnancy.  Was 2-3 cm when last checked.

## 2018-02-18 NOTE — MAU Note (Signed)
Pt here with c/o contractions that have worsened. Reports good fetal movement. Denies any leaking or bleeding.

## 2018-02-18 NOTE — Discharge Instructions (Signed)
Braxton Hicks Contractions °Contractions of the uterus can occur throughout pregnancy, but they are not always a sign that you are in labor. You may have practice contractions called Braxton Hicks contractions. These false labor contractions are sometimes confused with true labor. °What are Braxton Hicks contractions? °Braxton Hicks contractions are tightening movements that occur in the muscles of the uterus before labor. Unlike true labor contractions, these contractions do not result in opening (dilation) and thinning of the cervix. Toward the end of pregnancy (32-34 weeks), Braxton Hicks contractions can happen more often and may become stronger. These contractions are sometimes difficult to tell apart from true labor because they can be very uncomfortable. You should not feel embarrassed if you go to the hospital with false labor. °Sometimes, the only way to tell if you are in true labor is for your health care provider to look for changes in the cervix. The health care provider will do a physical exam and may monitor your contractions. If you are not in true labor, the exam should show that your cervix is not dilating and your water has not broken. °If there are other health problems associated with your pregnancy, it is completely safe for you to be sent home with false labor. You may continue to have Braxton Hicks contractions until you go into true labor. °How to tell the difference between true labor and false labor °True labor °· Contractions last 30-70 seconds. °· Contractions become very regular. °· Discomfort is usually felt in the top of the uterus, and it spreads to the lower abdomen and low back. °· Contractions do not go away with walking. °· Contractions usually become more intense and increase in frequency. °· The cervix dilates and gets thinner. °False labor °· Contractions are usually shorter and not as strong as true labor contractions. °· Contractions are usually irregular. °· Contractions  are often felt in the front of the lower abdomen and in the groin. °· Contractions may go away when you walk around or change positions while lying down. °· Contractions get weaker and are shorter-lasting as time goes on. °· The cervix usually does not dilate or become thin. °Follow these instructions at home: °· Take over-the-counter and prescription medicines only as told by your health care provider. °· Keep up with your usual exercises and follow other instructions from your health care provider. °· Eat and drink lightly if you think you are going into labor. °· If Braxton Hicks contractions are making you uncomfortable: °? Change your position from lying down or resting to walking, or change from walking to resting. °? Sit and rest in a tub of warm water. °? Drink enough fluid to keep your urine pale yellow. Dehydration may cause these contractions. °? Do slow and deep breathing several times an hour. °· Keep all follow-up prenatal visits as told by your health care provider. This is important. °Contact a health care provider if: °· You have a fever. °· You have continuous pain in your abdomen. °Get help right away if: °· Your contractions become stronger, more regular, and closer together. °· You have fluid leaking or gushing from your vagina. °· You pass blood-tinged mucus (bloody show). °· You have bleeding from your vagina. °· You have low back pain that you never had before. °· You feel your baby’s head pushing down and causing pelvic pressure. °· Your baby is not moving inside you as much as it used to. °Summary °· Contractions that occur before labor are called Braxton   Hicks contractions, false labor, or practice contractions. °· Braxton Hicks contractions are usually shorter, weaker, farther apart, and less regular than true labor contractions. True labor contractions usually become progressively stronger and regular and they become more frequent. °· Manage discomfort from Braxton Hicks contractions by  changing position, resting in a warm bath, drinking plenty of water, or practicing deep breathing. °This information is not intended to replace advice given to you by your health care provider. Make sure you discuss any questions you have with your health care provider. °Document Released: 12/01/2016 Document Revised: 12/01/2016 Document Reviewed: 12/01/2016 °Elsevier Interactive Patient Education © 2018 Elsevier Inc. ° °

## 2018-02-19 ENCOUNTER — Inpatient Hospital Stay (HOSPITAL_COMMUNITY): Admission: RE | Admit: 2018-02-19 | Payer: BLUE CROSS/BLUE SHIELD | Source: Ambulatory Visit

## 2018-02-19 ENCOUNTER — Inpatient Hospital Stay (HOSPITAL_COMMUNITY): Admission: RE | Admit: 2018-02-19 | Payer: Self-pay | Source: Ambulatory Visit

## 2018-02-19 ENCOUNTER — Encounter (HOSPITAL_COMMUNITY): Payer: Self-pay | Admitting: Obstetrics and Gynecology

## 2018-02-19 DIAGNOSIS — Z3A41 41 weeks gestation of pregnancy: Secondary | ICD-10-CM

## 2018-02-19 DIAGNOSIS — O480 Post-term pregnancy: Secondary | ICD-10-CM

## 2018-02-19 LAB — RPR: RPR Ser Ql: NONREACTIVE

## 2018-02-19 LAB — TYPE AND SCREEN
ABO/RH(D): A POS
Antibody Screen: NEGATIVE

## 2018-02-19 LAB — ABO/RH: ABO/RH(D): A POS

## 2018-02-19 MED ORDER — BENZOCAINE-MENTHOL 20-0.5 % EX AERO
1.0000 "application " | INHALATION_SPRAY | CUTANEOUS | Status: DC | PRN
  Administered 2018-02-19: 1 via TOPICAL
  Filled 2018-02-19: qty 56

## 2018-02-19 MED ORDER — PRENATAL MULTIVITAMIN CH
1.0000 | ORAL_TABLET | Freq: Every day | ORAL | Status: DC
  Administered 2018-02-19 – 2018-02-20 (×2): 1 via ORAL
  Filled 2018-02-19 (×2): qty 1

## 2018-02-19 MED ORDER — DIBUCAINE 1 % RE OINT: 1.0000 "application " | TOPICAL_OINTMENT | RECTAL | Status: DC | PRN

## 2018-02-19 MED ORDER — ONDANSETRON HCL 4 MG PO TABS: 4.0000 mg | ORAL_TABLET | ORAL | Status: DC | PRN

## 2018-02-19 MED ORDER — IBUPROFEN 600 MG PO TABS
600.0000 mg | ORAL_TABLET | Freq: Four times a day (QID) | ORAL | Status: DC
  Administered 2018-02-19 – 2018-02-20 (×6): 600 mg via ORAL
  Filled 2018-02-19 (×6): qty 1

## 2018-02-19 MED ORDER — TETANUS-DIPHTH-ACELL PERTUSSIS 5-2.5-18.5 LF-MCG/0.5 IM SUSP: 0.5000 mL | Freq: Once | INTRAMUSCULAR | Status: DC

## 2018-02-19 MED ORDER — COCONUT OIL OIL
1.0000 "application " | TOPICAL_OIL | Status: DC | PRN
  Administered 2018-02-20: 1 via TOPICAL
  Filled 2018-02-19: qty 120

## 2018-02-19 MED ORDER — ACETAMINOPHEN 325 MG PO TABS: 650.0000 mg | ORAL_TABLET | ORAL | Status: DC | PRN

## 2018-02-19 MED ORDER — SIMETHICONE 80 MG PO CHEW: 80.0000 mg | CHEWABLE_TABLET | ORAL | Status: DC | PRN

## 2018-02-19 MED ORDER — ZOLPIDEM TARTRATE 5 MG PO TABS: 5.0000 mg | ORAL_TABLET | Freq: Every evening | ORAL | Status: DC | PRN

## 2018-02-19 MED ORDER — ONDANSETRON HCL 4 MG/2ML IJ SOLN: 4.0000 mg | INTRAMUSCULAR | Status: DC | PRN

## 2018-02-19 MED ORDER — WITCH HAZEL-GLYCERIN EX PADS: 1.0000 "application " | MEDICATED_PAD | CUTANEOUS | Status: DC | PRN

## 2018-02-19 MED ORDER — SENNOSIDES-DOCUSATE SODIUM 8.6-50 MG PO TABS
2.0000 | ORAL_TABLET | ORAL | Status: DC
  Administered 2018-02-19: 2 via ORAL
  Filled 2018-02-19: qty 2

## 2018-02-19 MED ORDER — DIPHENHYDRAMINE HCL 25 MG PO CAPS: 25.0000 mg | ORAL_CAPSULE | Freq: Four times a day (QID) | ORAL | Status: DC | PRN

## 2018-02-19 NOTE — H&P (Signed)
LABOR AND DELIVERY ADMISSION HISTORY AND PHYSICAL NOTE  Anna Beck is a 32 y.o. female [phone] with IUP at [redacted]w[redacted]d presenting for SOL. She started contracting around 0600 on 02/18/18. She reports that her contractions have gotten worse since then. She reports positive fetal movement. She denies leakage of fluid or vaginal bleeding.  Prenatal History/Complications: PNC at [facility] Pregnancy complications:  - h/o spinal fusion & Harrington rods x 2, closely spaced pregnancies (SAB in 2018 & current), Anemia  Past Medical History: Past Medical History:  Diagnosis Date  . Anemia   . Fibroid uterus   . Scoliosis   . Sickle cell trait ([place])   . Trichomonas infection    early 20's    Past Surgical History: Past Surgical History:  Procedure Laterality Date  . CHOLECYSTECTOMY    . DG SCOLIOSIS STUDY ENTIRE SPINE ([place] [facility])     States corrective surgery of scoliosis  . ROOT CANAL      Obstetrical History: OB History    Gravida  4   Para  1   Term  1   Preterm      AB  2   Living  1     SAB  1   TAB  1   Ectopic      Multiple      Live Births  1           Social History: Social History   Socioeconomic History  . Marital status: Single    Spouse name: Not on file  . Number of children: Not on file  . Years of education: Not on file  . Highest education level: Not on file  Occupational History  . Not on file  Social Needs  . Financial resource strain: Not on file  . Food insecurity:    Worry: Not on file    Inability: Not on file  . Transportation needs:    Medical: Not on file    Non-medical: Not on file  Tobacco Use  . Smoking status: Former Smoker    Types: Cigarettes  . Smokeless tobacco: Never Used  . Tobacco comment: age 74  Substance and Sexual Activity  . Alcohol use: Not Currently    Comment: Twice a month  . Drug use: No  . Sexual activity: Yes    Birth control/protection: None  Lifestyle  . Physical activity:    Days per week: Not  on file    Minutes per session: Not on file  . Stress: Not on file  Relationships  . Social connections:    Talks on phone: Not on file    Gets together: Not on file    Attends religious service: Not on file    Active member of club or organization: Not on file    Attends meetings of clubs or organizations: Not on file    Relationship status: Not on file  Other Topics Concern  . Not on file  Social History Narrative  . Not on file    Family History: Family History  Problem Relation Age of Onset  . Hypertension Mother   . Depression Mother   . Asthma Sister   . Heart disease Maternal Grandmother   . Alzheimer's disease Paternal Grandmother     Allergies: Allergies  Allergen Reactions  . Percocet [Oxycodone-Acetaminophen] Itching    Medications Prior to Admission  Medication Sig Dispense Refill Last Dose  . acetaminophen (TYLENOL) 500 MG tablet Take 500 mg by mouth every 6 (six) hours  as needed for mild pain.   02/18/2018 at Unknown time  . Prenatal Vit-Fe Fumarate-FA (PRENATAL MULTIVITAMIN) TABS tablet Take 1 tablet by mouth at bedtime.   02/17/2018 at Unknown time  . NIFEdipine (PROCARDIA) 10 MG capsule Take 1 capsule (10 mg total) by mouth every 6 (six) hours as needed (for greater than 5 contractions per hour). 30 capsule 0      Review of Systems  All systems reviewed and negative except as stated in HPI  Physical Exam Blood pressure 119/70, pulse 80, temperature 98.1 F (36.7 C), temperature source Oral, resp. rate 20, height 5\' 7"  (1.702 m), last menstrual period 05/08/2017, SpO2 100 %. General appearance: alert, cooperative and no distress Lungs: clear to auscultation bilaterally Heart: regular rate and rhythm Abdomen: soft, non-tender; bowel sounds normal Extremities: No calf swelling or tenderness Presentation: cephalic Fetal monitoring: 130 bpm / moderate variability / accels present / decels absent  Uterine activity: regular every 5-6 mins Dilation:  5 Effacement (%): 80 Station: -1 Exam by:: Sunday Corn, CNM  Prenatal labs: ABO, Rh: A/Positive/-- (12/27 0000) Antibody: Negative (12/27 0000) Rubella: Immune (12/27 0000) RPR: Nonreactive (12/27 0000)  HBsAg: Negative (12/27 0000)  HIV: Non-reactive (12/27 0000)  GC/Chlamydia: Negative/Negative HSV: Negative per pt GBS: Negative (06/14 0000)  1 hr Glucola: Normal Genetic screening:  Normal Anatomy US: Normal  Prenatal Transfer Tool  Maternal Diabetes: No Genetic Screening: Normal Maternal Ultrasounds/Referrals: Normal Fetal Ultrasounds or other Referrals:  None Maternal Substance Abuse:  No Significant Maternal Medications:  None Significant Maternal Lab Results: Lab values include: Group B Strep negative  Results for orders placed or performed during the hospital encounter of 02/18/18 (from the past 24 hour(s))  CBC   Collection Time: 02/18/18 11:21 PM  Result Value Ref Range   WBC 8.8 4.0 - 10.5 K/uL   RBC 4.09 3.87 - 5.11 MIL/uL   Hemoglobin 12.5 12.0 - 15.0 g/dL   HCT 36.1 36.0 - 46.0 %   MCV 88.3 78.0 - 100.0 fL   MCH 30.6 26.0 - 34.0 pg   MCHC 34.6 30.0 - 36.0 g/dL   RDW 14.0 11.5 - 15.5 %   Platelets 249 150 - 400 K/uL    Patient Active Problem List   Diagnosis Date Noted  . Indication for care in labor or delivery 02/18/2018    Assessment: Anna Beck is a 32 y.o. G4P1021 at [redacted]w[redacted]d here for SOL.  #Labor: active #Pain: Minimally managed with breathing teciniques; requesting IV pain meds #FWB: Category 1 #ID:  n/a #MOF: Breast #MOC:Nexplanon #Circ:  Alba Destine, CNM 02/19/2018, 12:11 AM

## 2018-02-19 NOTE — Lactation Note (Signed)
This note was copied from a baby's chart. Lactation Consultation Note  Patient Name: Anna Beck AYOKH'T Date: 02/19/2018 Reason for consult: Initial assessment;Mother's request;Term P2, infant 69 hours old, Mom request LC.  Mom tried BF son stopped after 2 days. Receives [place] in [place] attend BF classes in her pregnancy. Mom concern she did not have any milk reassured mom, colostrum and infant tummy size discussed ,  BF will stimulate  milk production. Mom latched infant left breast using cradle but infant did not have deep latch was very shallow and infant not fully on breast. Mom receptive to [facility] showing her cross-cradle position. Infant latched in cross-cradle position, wider gape, deep latch, lips flange top lip up and bottom wide and down like biting into an apple,  chin first when latched on breast  and audible swallowing was observed by LC. Infant feeding (15 minutes) mom still feeding infant when [place] left room.  Mom did breast compressions while feeding. Mom encouraged to feed baby 8-12 times/24 hours and with feeding cues.   LC discussed I&O. Reviewed Baby & Me book's Breastfeeding Basics.  Mom made aware of O/P services, breastfeeding support groups, community resources, and our phone # for post-discharge questions.  Maternal Data Formula Feeding for Exclusion: No Has patient been taught Hand Expression?: Yes Does the patient have breastfeeding experience prior to this delivery?: Yes  Feeding Feeding Type: Breast Fed Length of feed: 15 min(Still BF as LC left room)  LATCH Score Latch: Grasps breast easily, tongue down, lips flanged, rhythmical sucking.  Audible Swallowing: Spontaneous and intermittent  Type of Nipple: Everted at rest and after stimulation  Comfort (Breast/Nipple): Soft / non-tender  Hold (Positioning): Assistance needed to correctly position infant at breast and maintain latch.  LATCH Score: 9  Interventions Interventions: Breast  massage;Support pillows;Hand express;Position options;Assisted with latch;Breast compression;Breast feeding basics reviewed;Skin to skin;Adjust position  Lactation Tools Discussed/Used WIC Program: Yes(Per mom, atteded BF class.)   Consult Status Consult Status: Follow-up Date: 02/20/18 Follow-up type: In-patient    Vicente Serene 02/19/2018, 6:50 AM

## 2018-02-20 LAB — BIRTH TISSUE RECOVERY COLLECTION (PLACENTA DONATION)

## 2018-02-20 MED ORDER — IBUPROFEN 600 MG PO TABS: 600.0000 mg | ORAL_TABLET | Freq: Four times a day (QID) | ORAL | 0 refills | Status: DC

## 2018-02-20 NOTE — Progress Notes (Signed)
Post Partum Day 1 Subjective: no complaints, up ad lib, voiding and tolerating PO  Objective: Blood pressure 111/62, pulse 69, temperature 98 F (36.7 C), temperature source Oral, resp. rate 18, height 5\' 7"  (1.702 m), last menstrual period 05/08/2017, SpO2 100 %, currently breastfeeding with some difficulty.  Physical Exam:  General: alert, cooperative, fatigued and no distress Lochia: appropriate Uterine Fundus: firm, U-1 DVT Evaluation: No evidence of DVT seen on physical exam. Negative Homan's sign. No cords or calf tenderness. No significant calf/ankle edema.  Recent Labs    02/18/18 2321  HGB 12.5  HCT 36.1    Assessment/Plan: Discharge home, Lactation consult, Social Work consult and Contraception Nexplanon   LOS: 2 days   Laury Deep, MSN, CNM 02/20/2018, 6:44 AM

## 2018-02-20 NOTE — Lactation Note (Signed)
This note was copied from a baby's chart. Lactation Consultation Note  Patient Name: Anna Beck [id number] Date: 02/20/2018 Reason for consult: Follow-up assessment Mom states baby is latching to breast with ease.  Instructed to feed with cue.  Discussed milk coming to volume.  Mom would like an outpatient appointment.  Encouraged to call prn.  Maternal Data    Feeding    LATCH Score                   Interventions    Lactation Tools Discussed/Used     Consult Status Consult Status: Complete Follow-up type: Call as needed    [name] 02/20/2018, 2:10 PM

## 2018-02-20 NOTE — Progress Notes (Signed)
CSW spoke with bedside nurse and MOB declined to meet with CSW.  Bedside nurse and discharge nurse agreed to provide PMAD education.   There are no barriers to discharge.   Laurey Arrow, MSW, LCSW Clinical Social Work [phone]

## 2018-02-20 NOTE — Lactation Note (Signed)
This note was copied from a baby's chart. Lactation Consultation Note  Patient Name: Anna Beck Today's Date: 02/20/2018  P2, 25 hours female infant, no weight loss   LC assisted w/ latch per mom request. Mom very interested in having [facility] outpatient services once she is discharge as an follow-up. After few attempts of re-latchig, infant wide mouth gape, infant latched  well/  on left breast w/ foot ball hold, feet facing back,  audible swallowing heard, flanged top lip and lower jar.Latch-8  Readjusted pillows/ baby's head raised facing breast and  turning infant's body closer towards mom in hugging position .  Mom is pleased w/ latch.  Maternal Data    Feeding Feeding Type: Breast Fed Length of feed: 15 min(Still feeding when LC left room)  LATCH Score Latch: Repeated attempts needed to sustain latch, nipple held in mouth throughout feeding, stimulation needed to elicit sucking reflex.  Audible Swallowing: Spontaneous and intermittent  Type of Nipple: Everted at rest and after stimulation  Comfort (Breast/Nipple): Soft / non-tender  Hold (Positioning): Assistance needed to correctly position infant at breast and maintain latch.  LATCH Score: 8  Interventions    Lactation Tools Discussed/Used     Consult Status Consult Status: Follow-up Date: 02/20/18 Follow-up type: In-patient    Vicente Serene 02/20/2018, 3:21 AM

## 2018-02-20 NOTE — Discharge Summary (Signed)
OB Discharge Summary     Patient Name: Anna Beck DOB: 12-24-1985 MRN: [id number]  Date of admission: 02/18/2018 Delivering MD: Laury Deep   Date of discharge: 02/20/2018  Admitting diagnosis: 60 WKS, CTXS Intrauterine pregnancy: [redacted]w[redacted]d     Secondary diagnosis:  Active Problems:   Indication for care in labor or delivery   Postpartum care following vaginal delivery  Additional problems: none     Discharge diagnosis: Post-Term Pregnancy Delivered                                                                                                Post partum procedures:none  Augmentation: AROM and Pitocin  Complications: Manual extraction of placenta  Hospital course:  Onset of Labor With Vaginal Delivery     32 y.o. yo [id number] at [redacted]w[redacted]d was admitted in Active Labor on 02/18/2018. Patient had an uncomplicated labor course as follows:  Membrane Rupture Time/Date: 12:05 AM ,02/19/2018   Intrapartum Procedures: Episiotomy: None [1]                                         Lacerations:  Vaginal [6];1st degree [2]  Patient had a delivery of a Viable infant. 02/19/2018  Information for the patient's newborn:  Chablis, Losh [[id number]]  Delivery Method: Vaginal, Spontaneous(Filed from Delivery Summary)    Pateint had an uncomplicated postpartum course.  She is ambulating, tolerating a regular diet, passing flatus, and urinating well. Patient is discharged home in stable condition on 02/20/18.   Physical exam  Vitals:   02/19/18 1300 02/19/18 1730 02/19/18 2115 02/20/18 0517  BP: 113/74 120/69 117/72 111/62  Pulse: 74 82 74 69  Resp: 18 18 18 18   Temp: 97.8 F (36.6 C) 98.1 F (36.7 C) 98.1 F (36.7 C) 98 F (36.7 C)  TempSrc: Oral Oral Oral Oral  SpO2: 99% 100% 100%   Height:       General: alert, cooperative and no distress Lochia: appropriate Uterine Fundus: firm, U-1 DVT Evaluation: No evidence of DVT seen on physical exam. Negative Homan's sign. No cords or  calf tenderness. No significant calf/ankle edema. Labs: Lab Results  Component Value Date   WBC 8.8 02/18/2018   HGB 12.5 02/18/2018   HCT 36.1 02/18/2018   MCV 88.3 02/18/2018   PLT 249 02/18/2018   CMP Latest Ref Rng & Units 08/28/2017  Glucose 65 - 99 mg/dL 84  BUN 6 - 20 mg/dL <5(L)  Creatinine 0.44 - 1.00 mg/dL 0.53  Sodium 135 - 145 mmol/L 134(L)  Potassium 3.5 - 5.1 mmol/L 3.4(L)  Chloride 101 - 111 mmol/L 104  CO2 22 - 32 mmol/L 21(L)  Calcium 8.9 - 10.3 mg/dL 8.7(L)  Total Protein 6.5 - 8.1 g/dL 6.8  Total Bilirubin 0.3 - 1.2 mg/dL 0.3  Alkaline Phos 38 - 126 U/L 52  AST 15 - 41 U/L 17  ALT 14 - 54 U/L 16    Discharge instruction: per After Visit Summary and "Baby and Me Booklet".  After visit meds:  Allergies as of 02/20/2018      Reactions   Percocet [oxycodone-acetaminophen] Itching      Medication List    STOP taking these medications   acetaminophen 500 MG tablet Commonly known as:  TYLENOL     TAKE these medications   ibuprofen 600 MG tablet Commonly known as:  ADVIL,MOTRIN Take 1 tablet (600 mg total) by mouth every 6 (six) hours.   prenatal multivitamin Tabs tablet Take 1 tablet by mouth at bedtime.       Diet: routine diet  Activity: Advance as tolerated. Pelvic rest for 6 weeks.   Outpatient follow up:2 weeks for interval PP visit and 6 weeks for PP visit Follow up Appt:No future appointments. Follow up Visit:No follow-ups on file.  Postpartum contraception: Nexplanon  Newborn Data: Live born female "Nydia Bouton" Birth Weight: 8 lb 9.7 oz (3905 g) APGAR: 8, 9  Newborn Delivery   Birth date/time:  02/19/2018 02:07:00 Delivery type:  Vaginal, Spontaneous     Baby Feeding: Bottle and Breast Disposition:home with mother   02/20/2018 Laury Deep, CNM

## 2018-03-28 DIAGNOSIS — Z3009 Encounter for other general counseling and advice on contraception: Secondary | ICD-10-CM | POA: Diagnosis not present

## 2018-03-28 DIAGNOSIS — D259 Leiomyoma of uterus, unspecified: Secondary | ICD-10-CM | POA: Diagnosis not present

## 2018-03-30 DIAGNOSIS — Z3202 Encounter for pregnancy test, result negative: Secondary | ICD-10-CM | POA: Diagnosis not present

## 2018-03-30 DIAGNOSIS — Z30017 Encounter for initial prescription of implantable subdermal contraceptive: Secondary | ICD-10-CM | POA: Diagnosis not present

## 2018-04-18 DIAGNOSIS — J358 Other chronic diseases of tonsils and adenoids: Secondary | ICD-10-CM | POA: Diagnosis not present

## 2018-04-18 DIAGNOSIS — J3501 Chronic tonsillitis: Secondary | ICD-10-CM | POA: Diagnosis not present

## 2018-04-30 DIAGNOSIS — N760 Acute vaginitis: Secondary | ICD-10-CM | POA: Diagnosis not present

## 2018-04-30 DIAGNOSIS — Z6831 Body mass index (BMI) 31.0-31.9, adult: Secondary | ICD-10-CM | POA: Diagnosis not present

## 2018-05-10 ENCOUNTER — Other Ambulatory Visit: Payer: Self-pay

## 2018-05-10 ENCOUNTER — Encounter: Payer: Self-pay | Admitting: *Deleted

## 2018-05-16 NOTE — Discharge Instructions (Signed)
T & A INSTRUCTION SHEET - [facility] °[facility] ° °CREIGHTON VAUGHT, MD °PAUL H. JUENGEL, MD  °P. SCOTT BENNETT °CHAPMAN MCQUEEN, MD ° °[address] [place], [place] [address] TEL. [phone] °[address] [place] [address] [phone] ° °INFORMATION SHEET FOR A TONSILLECTOMY AND ADENDOIDECTOMY ° °About Your Tonsils and Adenoids ° The tonsils and adenoids are normal body tissues that are part of our immune system.  They normally help to protect us against diseases that may enter our mouth and nose.  However, sometimes the tonsils and/or adenoids become too large and obstruct our breathing, especially at night. °  ° If either of these things happen it helps to remove the tonsils and adenoids in order to become healthier. The operation to remove the tonsils and adenoids is called a tonsillectomy and adenoidectomy. ° °The Location of Your Tonsils and Adenoids ° The tonsils are located in the back of the throat on both side and sit in a cradle of muscles. The adenoids are located in the roof of the mouth, behind the nose, and closely associated with the opening of the Eustachian tube to the ear. ° °Surgery on Tonsils and Adenoids ° A tonsillectomy and adenoidectomy is a short operation which takes about thirty minutes.  This includes being put to sleep and being awakened.  Tonsillectomies and adenoidectomies are performed at [facility] and may require observation period in the recovery room prior to going home. ° °Following the Operation for a Tonsillectomy ° A cautery machine is used to control bleeding.  Bleeding from a tonsillectomy and adenoidectomy is minimal and postoperatively the risk of bleeding is approximately four percent, although this rarely life threatening. ° ° ° °After your tonsillectomy and adenoidectomy post-op care at home: ° °1. Our patients are able to go home the same day.  You may be given prescriptions for pain  medications and antibiotics, if indicated. °2. It is extremely important to remember that fluid intake is of utmost importance after a tonsillectomy.  The amount that you drink must be maintained in the postoperative period.  A good indication of whether a child is getting enough fluid is whether his/her urine output is constant.  As long as children are urinating or wetting their diaper every 6 - 8 hours this is usually enough fluid intake.   °3. Although rare, this is a risk of some bleeding in the first ten days after surgery.  This is usually occurs between day five and nine postoperatively.  This risk of bleeding is approximately four percent.  If you or your child should have any bleeding you should remain calm and notify our office or go directly to the Emergency Room at [facility] where they will contact us. Our doctors are available seven days a week for notification.  We recommend sitting up quietly in a chair, place an ice pack on the front of the neck and spitting out the blood gently until we are able to contact you.  Adults should gargle gently with ice water and this may help stop the bleeding.  If the bleeding does not stop after a short time, i.e. 10 to 15 minutes, or seems to be increasing again, please contact us or go to the hospital.   °4. It is common for the pain to be worse at 5 - 7 days postoperatively.  This occurs because the “scab” is peeling off and the mucous membrane (skin of   the throat) is growing back where the tonsils were.   °5. It is common for a low-grade fever, less than 102, during the first week after a tonsillectomy and adenoidectomy.  It is usually due to not drinking enough liquids, and we suggest your use liquid Tylenol or the pain medicine with Tylenol prescribed in order to keep your temperature below 102.  Please follow the directions on the back of the bottle. °6. Do not take aspirin or any products that contain aspirin such as Bufferin, Anacin,  Ecotrin, aspirin gum, Goodies, BC headache powders, etc., after a T&A because it can promote bleeding.  Please check with our office before administering any other medication that may been prescribed by other doctors during the two week post-operative period. °7. If you happen to look in the mirror or into your child’s mouth you will see white/gray patches on the back of the throat.  This is what a scab looks like in the mouth and is normal after having a T&A.  It will disappear once the tonsil area heals completely. However, it may cause a noticeable odor, and this too will disappear with time.     °8. You or your child may experience ear pain after having a T&A.  This is called referred pain and comes from the throat, but it is felt in the ears.  Ear pain is quite common and expected.  It will usually go away after ten days.  There is usually nothing wrong with the ears, and it is primarily due to the healing area stimulating the nerve to the ear that runs along the side of the throat.  Use either the prescribed pain medicine or Tylenol as needed.  °9. The throat tissues after a tonsillectomy are obviously sensitive.  Smoking around children who have had a tonsillectomy significantly increases the risk of bleeding.  DO NOT SMOKE!  ° °General Anesthesia, Adult, Care After °These instructions provide you with information about caring for yourself after your procedure. Your health care provider may also give you more specific instructions. Your treatment has been planned according to current medical practices, but problems sometimes occur. Call your health care provider if you have any problems or questions after your procedure. °What can I expect after the procedure? °After the procedure, it is common to have: °· Vomiting. °· A sore throat. °· Mental slowness. ° °It is common to feel: °· Nauseous. °· Cold or shivery. °· Sleepy. °· Tired. °· Sore or achy, even in parts of your body where you did not have  surgery. ° °Follow these instructions at home: °For at least 24 hours after the procedure: °· Do not: °? Participate in activities where you could fall or become injured. °? Drive. °? Use heavy machinery. °? Drink alcohol. °? Take sleeping pills or medicines that cause drowsiness. °? Make important decisions or sign legal documents. °? Take care of children on your own. °· Rest. °Eating and drinking °· If you vomit, drink water, juice, or soup when you can drink without vomiting. °· Drink enough fluid to keep your urine clear or pale yellow. °· Make sure you have little or no nausea before eating solid foods. °· Follow the diet recommended by your health care provider. °General instructions °· Have a responsible adult stay with you until you are awake and alert. °· Return to your normal activities as told by your health care provider. Ask your health care provider what activities are safe for you. °· Take over-the-counter and   prescription medicines only as told by your health care provider. °· If you smoke, do not smoke without supervision. °· Keep all follow-up visits as told by your health care provider. This is important. °Contact a health care provider if: °· You continue to have nausea or vomiting at home, and medicines are not helpful. °· You cannot drink fluids or start eating again. °· You cannot urinate after 8-12 hours. °· You develop a skin rash. °· You have fever. °· You have increasing redness at the site of your procedure. °Get help right away if: °· You have difficulty breathing. °· You have chest pain. °· You have unexpected bleeding. °· You feel that you are having a life-threatening or urgent problem. °This information is not intended to replace advice given to you by your health care provider. Make sure you discuss any questions you have with your health care provider. °Document Released: 10/24/2000 Document Revised: 12/21/2015 Document Reviewed: 07/02/2015 °Elsevier Interactive Patient Education  © 2018 [employer] ° °

## 2018-05-18 ENCOUNTER — Ambulatory Visit: Payer: BLUE CROSS/BLUE SHIELD | Admitting: Anesthesiology

## 2018-05-18 ENCOUNTER — Encounter
Admission: RE | Disposition: A | Discharge status: Home / Self Care | Payer: Self-pay | Source: Ambulatory Visit | Attending: Unknown Physician Specialty

## 2018-05-18 ENCOUNTER — Ambulatory Visit
Admission: RE | Admit: 2018-05-18 | Discharge: 2018-05-18 | Disposition: A | Discharge status: Home / Self Care | Payer: BLUE CROSS/BLUE SHIELD | Source: Ambulatory Visit | Attending: Unknown Physician Specialty | Admitting: Unknown Physician Specialty

## 2018-05-18 ENCOUNTER — Other Ambulatory Visit: Payer: Self-pay

## 2018-05-18 DIAGNOSIS — J3501 Chronic tonsillitis: Secondary | ICD-10-CM | POA: Insufficient documentation

## 2018-05-18 DIAGNOSIS — J353 Hypertrophy of tonsils with hypertrophy of adenoids: Secondary | ICD-10-CM | POA: Diagnosis not present

## 2018-05-18 HISTORY — PX: TONSILLECTOMY: SHX5217

## 2018-05-18 SURGERY — TONSILLECTOMY
Anesthesia: General | Site: Throat | Laterality: Bilateral

## 2018-05-18 MED ORDER — LACTATED RINGERS IV SOLN
INTRAVENOUS | Status: DC
  Administered 2018-05-18: 09:00:00 via INTRAVENOUS

## 2018-05-18 MED ORDER — FENTANYL CITRATE (PF) 100 MCG/2ML IJ SOLN
25.0000 ug | INTRAMUSCULAR | Status: DC | PRN
  Administered 2018-05-18: 50 ug via INTRAVENOUS
  Administered 2018-05-18 (×2): 25 ug via INTRAVENOUS

## 2018-05-18 MED ORDER — SCOPOLAMINE 1 MG/3DAYS TD PT72
1.0000 | MEDICATED_PATCH | Freq: Once | TRANSDERMAL | Status: DC
  Administered 2018-05-18: 1.5 mg via TRANSDERMAL

## 2018-05-18 MED ORDER — ONDANSETRON HCL 4 MG/2ML IJ SOLN: 4.0000 mg | Freq: Once | INTRAMUSCULAR | Status: DC | PRN

## 2018-05-18 MED ORDER — HYDROCODONE-ACETAMINOPHEN 7.5-325 MG/15ML PO SOLN: 15.0000 mL | Freq: Four times a day (QID) | ORAL | 0 refills | Status: DC | PRN

## 2018-05-18 MED ORDER — BUPIVACAINE HCL (PF) 0.5 % IJ SOLN
INTRAMUSCULAR | Status: DC | PRN
  Administered 2018-05-18: 8 mL

## 2018-05-18 MED ORDER — SUCCINYLCHOLINE CHLORIDE 20 MG/ML IJ SOLN
INTRAMUSCULAR | Status: DC | PRN
  Administered 2018-05-18: 100 mg via INTRAVENOUS

## 2018-05-18 MED ORDER — FENTANYL CITRATE (PF) 100 MCG/2ML IJ SOLN
INTRAMUSCULAR | Status: DC | PRN
  Administered 2018-05-18: 50 ug via INTRAVENOUS

## 2018-05-18 MED ORDER — DEXAMETHASONE SODIUM PHOSPHATE 4 MG/ML IJ SOLN
INTRAMUSCULAR | Status: DC | PRN
  Administered 2018-05-18: 10 mg via INTRAVENOUS

## 2018-05-18 MED ORDER — OXYCODONE HCL 5 MG PO TABS: 10.0000 mg | ORAL_TABLET | Freq: Once | ORAL | Status: DC

## 2018-05-18 MED ORDER — ONDANSETRON HCL 4 MG/2ML IJ SOLN
INTRAMUSCULAR | Status: DC | PRN
  Administered 2018-05-18: 4 mg via INTRAVENOUS

## 2018-05-18 MED ORDER — PROPOFOL 10 MG/ML IV BOLUS
INTRAVENOUS | Status: DC | PRN
  Administered 2018-05-18: 160 mg via INTRAVENOUS

## 2018-05-18 MED ORDER — LIDOCAINE HCL (CARDIAC) PF 100 MG/5ML IV SOSY: PREFILLED_SYRINGE | INTRAVENOUS | Status: DC | PRN

## 2018-05-18 MED ORDER — MIDAZOLAM HCL 5 MG/5ML IJ SOLN
INTRAMUSCULAR | Status: DC | PRN
  Administered 2018-05-18: 2 mg via INTRAVENOUS

## 2018-05-18 MED ORDER — LIDOCAINE HCL (CARDIAC) PF 100 MG/5ML IV SOSY
PREFILLED_SYRINGE | INTRAVENOUS | Status: DC | PRN
  Administered 2018-05-18: 30 mg via INTRAVENOUS

## 2018-05-18 MED ORDER — GLYCOPYRROLATE 0.2 MG/ML IJ SOLN
INTRAMUSCULAR | Status: DC | PRN
  Administered 2018-05-18: 0.1 mg via INTRAVENOUS

## 2018-05-18 MED ORDER — OXYCODONE HCL 5 MG/5ML PO SOLN
5.0000 mg | Freq: Once | ORAL | Status: AC
  Administered 2018-05-18: 5 mg via ORAL

## 2018-05-18 MED ORDER — ACETAMINOPHEN 10 MG/ML IV SOLN
1000.0000 mg | Freq: Once | INTRAVENOUS | Status: AC
  Administered 2018-05-18: 1000 mg via INTRAVENOUS

## 2018-05-18 MED ORDER — PROPOFOL 10 MG/ML IV BOLUS: INTRAVENOUS | Status: DC | PRN

## 2018-05-18 SURGICAL SUPPLY — 19 items
"PENCIL ELECTRO HAND CTR " (MISCELLANEOUS) ×1 IMPLANT
CANISTER SUCT 1200ML W/VALVE (MISCELLANEOUS) ×2 IMPLANT
COAG SUCT 10F 3.5MM HAND CTRL (MISCELLANEOUS) ×2 IMPLANT
DRAPE HEAD BAR (DRAPES) ×2 IMPLANT
ELECT CAUTERY BLADE TIP 2.5 (TIP) ×2
ELECT REM PT RETURN 9FT ADLT (ELECTROSURGICAL) ×2
ELECTRODE CAUTERY BLDE TIP 2.5 (TIP) ×1 IMPLANT
ELECTRODE REM PT RTRN 9FT ADLT (ELECTROSURGICAL) ×1 IMPLANT
GLOVE BIO SURGEON STRL SZ7.5 (GLOVE) ×2 IMPLANT
HANDLE SUCTION POOLE (INSTRUMENTS) ×1 IMPLANT
KIT TURNOVER KIT A (KITS) ×2 IMPLANT
NDL HYPO 25GX1X1/2 BEV (NEEDLE) ×1 IMPLANT
NEEDLE HYPO 25GX1X1/2 BEV (NEEDLE) ×2 IMPLANT
NS IRRIG 500ML POUR BTL (IV SOLUTION) ×2 IMPLANT
PACK TONSIL/ADENOIDS (PACKS) ×2 IMPLANT
PENCIL ELECTRO HAND CTR (MISCELLANEOUS) ×2 IMPLANT
STRAP BODY AND KNEE 60X3 (MISCELLANEOUS) ×2 IMPLANT
SUCTION POOLE HANDLE (INSTRUMENTS) ×2
SYR 5ML LL (SYRINGE) ×2 IMPLANT

## 2018-05-18 NOTE — Anesthesia Procedure Notes (Signed)
Procedure Name: Intubation Date/Time: 05/18/2018 10:01 AM Performed by: Cameron Ali, CRNA Pre-anesthesia Checklist: Patient identified, Emergency Drugs available, Suction available, Patient being monitored and Timeout performed Patient Re-evaluated:Patient Re-evaluated prior to induction Oxygen Delivery Method: Circle system utilized Preoxygenation: Pre-oxygenation with 100% oxygen Induction Type: IV induction Ventilation: Mask ventilation without difficulty Grade View: Grade I Tube type: Oral Rae Tube size: 7.5 mm Number of attempts: 1 Placement Confirmation: ETT inserted through vocal cords under direct vision,  positive ETCO2 and breath sounds checked- equal and bilateral Tube secured with: Tape Dental Injury: Teeth and Oropharynx as per pre-operative assessment

## 2018-05-18 NOTE — Anesthesia Postprocedure Evaluation (Signed)
Anesthesia Post Note  Patient: Anna Beck  Procedure(s) Performed: TONSILLECTOMY (Bilateral Throat)  Patient location during evaluation: PACU Anesthesia Type: General Level of consciousness: awake and alert and oriented Pain management: satisfactory to patient Vital Signs Assessment: post-procedure vital signs reviewed and stable Respiratory status: spontaneous breathing, nonlabored ventilation and respiratory function stable Cardiovascular status: blood pressure returned to baseline and stable Postop Assessment: Adequate PO intake and No signs of nausea or vomiting Anesthetic complications: no    Raliegh Ip

## 2018-05-18 NOTE — Anesthesia Preprocedure Evaluation (Signed)
Anesthesia Evaluation  Patient identified by MRN, date of birth, ID band Patient awake    Reviewed: Allergy & Precautions, H&P , NPO status , Patient's Chart, lab work & pertinent test results  Airway Mallampati: II  TM Distance: >3 FB Neck ROM: full    Dental no notable dental hx.    Pulmonary    Pulmonary exam normal breath sounds clear to auscultation       Cardiovascular Normal cardiovascular exam Rhythm:regular Rate:Normal     Neuro/Psych    GI/Hepatic   Endo/Other    Renal/GU      Musculoskeletal   Abdominal   Peds  Hematology   Anesthesia Other Findings   Reproductive/Obstetrics                             Anesthesia Physical Anesthesia Plan  ASA: II  Anesthesia Plan: General ETT   Post-op Pain Management:    Induction:   PONV Risk Score and Plan: 3 and Scopolamine patch - Pre-op, Ondansetron, Dexamethasone and Treatment may vary due to age or medical condition  Airway Management Planned:   Additional Equipment:   Intra-op Plan:   Post-operative Plan:   Informed Consent: I have reviewed the patients History and Physical, chart, labs and discussed the procedure including the risks, benefits and alternatives for the proposed anesthesia with the patient or authorized representative who has indicated his/her understanding and acceptance.     Plan Discussed with: CRNA  Anesthesia Plan Comments:         Anesthesia Quick Evaluation

## 2018-05-18 NOTE — Op Note (Signed)
PREOPERATIVE DIAGNOSIS:  CHRONIC TONSILLITIS  POSTOPERATIVE DIAGNOSIS:  Chronic Tonsillitis  OPERATION:  Tonsillectomy.  SURGEON:  Roena Malady, MD  ANESTHESIA:  General endotracheal.  OPERATIVE FINDINGS:  Large tonsils.  DESCRIPTION OF THE PROCEDURE: MARETA CHESNUT was identified in the holding area and taken to the operating room and placed in the supine position.  After general endotracheal anesthesia, the table was turned 45 degrees and the patient was draped in the usual fashion for a tonsillectomy.  A mouth gag was inserted into the oral cavity.  There were large tonsils.  Beginning on the left-hand side a tenaculum was used to grasp the tonsil and the Bovie cautery was used to dissect it free from the fossa.  In a similar fashion, the right tonsil was removed.  Meticulous hemostasis was achieved using the Bovie cautery.  With both tonsils removed and no active bleeding, 0.5% plain Marcaine was used to inject the anterior and posterior tonsillar pillars bilaterally.  A total of 67ml was used.  The patient tolerated the procedure well and was awakened in the operating room and taken to the recovery room in stable condition.   CULTURES:  None.  SPECIMENS:  Tonsils.  ESTIMATED BLOOD LOSS:  Less than 10 ml.  Roena Malady  05/18/2018  10:12 AM

## 2018-05-18 NOTE — Transfer of Care (Signed)
Immediate Anesthesia Transfer of Care Note  Patient: Anna Beck  Procedure(s) Performed: TONSILLECTOMY (Bilateral Throat)  Patient Location: PACU  Anesthesia Type: General ETT  Level of Consciousness: awake, alert  and patient cooperative  Airway and Oxygen Therapy: Patient Spontanous Breathing and Patient connected to supplemental oxygen  Post-op Assessment: Post-op Vital signs reviewed, Patient's Cardiovascular Status Stable, Respiratory Function Stable, Patent Airway and No signs of Nausea or vomiting  Post-op Vital Signs: Reviewed and stable  Complications: No apparent anesthesia complications

## 2018-05-18 NOTE — H&P (Signed)
The patient's history has been reviewed, patient examined, no change in status, stable for surgery.  Questions were answered to the patients satisfaction.  

## 2018-05-22 LAB — SURGICAL PATHOLOGY

## 2018-05-24 ENCOUNTER — Emergency Department
Admission: EM | Admit: 2018-05-24 | Discharge: 2018-05-24 | Disposition: A | Discharge status: Home / Self Care | Payer: BLUE CROSS/BLUE SHIELD | Attending: Emergency Medicine | Admitting: Emergency Medicine

## 2018-05-24 ENCOUNTER — Encounter: Payer: Self-pay | Admitting: Emergency Medicine

## 2018-05-24 ENCOUNTER — Other Ambulatory Visit: Payer: Self-pay

## 2018-05-24 ENCOUNTER — Emergency Department: Payer: BLUE CROSS/BLUE SHIELD

## 2018-05-24 DIAGNOSIS — R1310 Dysphagia, unspecified: Secondary | ICD-10-CM | POA: Diagnosis not present

## 2018-05-24 DIAGNOSIS — G8918 Other acute postprocedural pain: Secondary | ICD-10-CM | POA: Insufficient documentation

## 2018-05-24 DIAGNOSIS — R070 Pain in throat: Secondary | ICD-10-CM | POA: Diagnosis not present

## 2018-05-24 LAB — BASIC METABOLIC PANEL
Anion gap: 7 (ref 5–15)
BUN: 6 mg/dL (ref 6–20)
CO2: 28 mmol/L (ref 22–32)
Calcium: 9.5 mg/dL (ref 8.9–10.3)
Chloride: 104 mmol/L (ref 98–111)
Creatinine, Ser: 0.57 mg/dL (ref 0.44–1.00)
GFR calc Af Amer: >60 mL/min (ref >60)
GFR calc non Af Amer: >60 mL/min (ref >60)
GLUCOSE: 96 mg/dL (ref 70–99)
POTASSIUM: 4 mmol/L (ref 3.5–5.1)
Sodium: 139 mmol/L (ref 135–145)

## 2018-05-24 LAB — CBC WITH DIFFERENTIAL/PLATELET
ABS IMMATURE GRANULOCYTES: 0.02 10*3/uL (ref 0.00–0.07)
BASOS ABS: 0 10*3/uL (ref 0.0–0.1)
Basophils Relative: 0 %
Eosinophils Absolute: 0.1 10*3/uL (ref 0.0–0.5)
Eosinophils Relative: 2 %
HCT: 36.9 % (ref 36.0–46.0)
Hemoglobin: 12.3 g/dL (ref 12.0–15.0)
Immature Granulocytes: 0 %
LYMPHS ABS: 1.7 10*3/uL (ref 0.7–4.0)
LYMPHS PCT: 28 %
MCH: 29.6 pg (ref 26.0–34.0)
MCHC: 33.3 g/dL (ref 30.0–36.0)
MCV: 88.9 fL (ref 80.0–100.0)
Monocytes Absolute: 0.4 10*3/uL (ref 0.1–1.0)
Monocytes Relative: 6 %
NEUTROS ABS: 3.9 10*3/uL (ref 1.7–7.7)
NRBC: 0 % (ref 0.0–0.2)
Neutrophils Relative %: 64 %
PLATELETS: 331 10*3/uL (ref 150–400)
RBC: 4.15 MIL/uL (ref 3.87–5.11)
RDW: 13.6 % (ref 11.5–15.5)
WBC: 6.1 10*3/uL (ref 4.0–10.5)

## 2018-05-24 MED ORDER — PREDNISONE 20 MG PO TABS: 60.0000 mg | ORAL_TABLET | Freq: Every day | ORAL | 0 refills | Status: DC

## 2018-05-24 MED ORDER — SODIUM CHLORIDE 0.9 % IV SOLN
Freq: Once | INTRAVENOUS | Status: AC
  Administered 2018-05-24: 14:00:00 via INTRAVENOUS

## 2018-05-24 MED ORDER — IOHEXOL 300 MG/ML  SOLN
75.0000 mL | Freq: Once | INTRAMUSCULAR | Status: AC | PRN
  Administered 2018-05-24: 75 mL via INTRAVENOUS

## 2018-05-24 MED ORDER — PROMETHAZINE HCL 25 MG RE SUPP: 25.0000 mg | Freq: Four times a day (QID) | RECTAL | 1 refills | Status: DC | PRN

## 2018-05-24 MED ORDER — DEXAMETHASONE SODIUM PHOSPHATE 10 MG/ML IJ SOLN
10.0000 mg | Freq: Once | INTRAMUSCULAR | Status: DC
  Filled 2018-05-24: qty 1

## 2018-05-24 MED ORDER — DEXAMETHASONE SODIUM PHOSPHATE 10 MG/ML IJ SOLN
10.0000 mg | Freq: Once | INTRAMUSCULAR | Status: AC
  Administered 2018-05-24: 10 mg via INTRAMUSCULAR

## 2018-05-24 NOTE — ED Provider Notes (Addendum)
[facility] Emergency Department Provider Note  ____________________________________________   I have reviewed the triage vital signs and the nursing notes. Where available I have reviewed prior notes and, if possible and indicated, outside hospital notes.    HISTORY  Chief Complaint Post-op Problem    HPI Anna Beck is a 32 y.o. female  2 states that 6 days ago she had her tonsils taken out by Dr. Tami Ribas. She is supposed to be taking hydrocodone liquid, however, she stopped taking it because it was making her nauseated. She has not vomited she's does not have any bleeding, she states that she has been having trouble swallowing even liquids. Denies fever or chills. pain is in her throat worse when she swallows at her when she doesn't no other alleviating or aggravating factors no otherprior treatment.   Past Medical History:  Diagnosis Date  . Anemia   . Fibroid uterus   . Scoliosis   . Sickle cell trait ([place])   . Trichomonas infection    early 20's    Patient Active Problem List   Diagnosis Date Noted  . Postpartum care following vaginal delivery 02/19/2018  . Indication for care in labor or delivery 02/18/2018    Past Surgical History:  Procedure Laterality Date  . CHOLECYSTECTOMY    . DG SCOLIOSIS STUDY ENTIRE SPINE ([place] HX)     States corrective surgery of scoliosis  . ROOT CANAL    . TONSILLECTOMY Bilateral 05/18/2018   Procedure: TONSILLECTOMY;  Surgeon: Beverly Gust, MD;  Location: [facility];  Service: ENT;  Laterality: Bilateral;    Prior to Admission medications   Medication Sig Start Date End Date Taking? Authorizing Provider  HYDROcodone-acetaminophen (HYCET) 7.5-325 mg/15 ml solution Take 15 mLs by mouth 4 (four) times daily as needed for moderate pain. 05/18/18 05/18/19  Beverly Gust, MD  ibuprofen (ADVIL,MOTRIN) 600 MG tablet Take 1 tablet (600 mg total) by mouth every 6 (six) hours. Patient not taking:  Reported on 05/10/2018 02/20/18   Laury Deep, CNM  Prenatal Vit-Fe Fumarate-FA (PRENATAL MULTIVITAMIN) TABS tablet Take 1 tablet by mouth at bedtime.    [provider]    Allergies Percocet [oxycodone-acetaminophen]  Family History  Problem Relation Age of Onset  . Hypertension Mother   . Depression Mother   . Asthma Sister   . Heart disease Maternal Grandmother   . Alzheimer's disease Paternal Grandmother     Social History Social History   Tobacco Use  . Smoking status: Never Smoker  . Smokeless tobacco: Never Used  . Tobacco comment: age 17 "tried it, didn't like it"  Substance Use Topics  . Alcohol use: Not Currently    Comment: Twice a month  . Drug use: No    Review of Systems Constitutional: No fever/chills Eyes: No visual changes. ENT: No sore throat. No stiff neck no neck pain Cardiovascular: Denies chest pain. Respiratory: Denies shortness of breath. Gastrointestinal:   no vomiting.  No diarrhea.  No constipation. Genitourinary: Negative for dysuria. Musculoskeletal: Negative lower extremity swelling Skin: Negative for rash. Neurological: Negative for severe headaches, focal weakness or numbness.   ____________________________________________   PHYSICAL EXAM:  VITAL SIGNS: ED Triage Vitals [05/24/18 1413]  Enc Vitals Group     BP 120/81     Pulse Rate 78     Resp 18     Temp 98.3 F (36.8 C)     Temp Source Oral     SpO2 99 %  Weight 200 lb (90.7 kg)     Height 5\' 7"  (1.702 m)     Head Circumference      Peak Flow      Pain Score 10     Pain Loc      Pain Edu?      Excl. in [place]?     Constitutional: Alert and oriented. Well appearing and in no acute distress. Eyes: Conjunctivae are normal Head: Atraumatic HEENT:  fractious scabs in the posterior pharynx, patient's voice is slightly quiet but is no stridor or evidence of respiratory distress.. Mucous membranes are moist.  Oropharynx non-erythematous Neck:   Nontender with  no meningismus, no masses, no stridor Cardiovascular: Normal rate, regular rhythm. Grossly normal heart sounds.  Good peripheral circulation. Respiratory: Normal respiratory effort.  No retractions. Lungs CTAB. Abdominal: Soft and nontender. No distention. No guarding no rebound Back:  There is no focal tenderness or step off.  there is no midline tenderness there are no lesions noted. there is no CVA tenderness Musculoskeletal: No lower extremity tenderness, no upper extremity tenderness. No joint effusions, no DVT signs strong distal pulses no edema Neurologic:  Normal speech and language. No gross focal neurologic deficits are appreciated.  Skin:  Skin is warm, dry and intact. No rash noted. Psychiatric: Mood and affect are normal. Speech and behavior are normal.  ____________________________________________   LABS (all labs ordered are listed, but only abnormal results are displayed)  Labs Reviewed  CBC WITH DIFFERENTIAL/PLATELET  BASIC METABOLIC PANEL    Pertinent labs  results that were available during my care of the patient were reviewed by me and considered in my medical decision making (see chart for details). ____________________________________________  EKG  I personally interpreted any EKGs ordered by me or triage  ____________________________________________  RADIOLOGY  Pertinent labs & imaging results that were available during my care of the patient were reviewed by me and considered in my medical decision making (see chart for details). If possible, patient and/or family made aware of any abnormal findings.  Ct Soft Tissue Neck W Contrast  Result Date: 05/24/2018 CLINICAL DATA:  32 year old female status post tonsillectomy six days ago. Difficulty swallowing, muffled speech. Headache. EXAM: CT NECK WITH CONTRAST TECHNIQUE: Multidetector CT imaging of the neck was performed using the standard protocol following the bolus administration of intravenous contrast.  CONTRAST:  55mL OMNIPAQUE IOHEXOL 300 MG/ML  SOLN COMPARISON:  Head CT without contrast 02/23/2006. FINDINGS: Pharynx and larynx: The glottis is closed but otherwise the laryngeal soft tissues appear within normal limits. No convincing laryngeal edema. Epiglottis is within normal limits. Hypopharynx and oropharynx soft tissue contours are within normal limits. Negative retropharyngeal space. The nasopharynx likewise appears within normal limits. No postoperative fluid collection. There is minimal gas in the left parapharyngeal space fat anterior to the course of the left stylohyoid ligament. Otherwise negative parapharyngeal spaces. Salivary glands: Negative sublingual space. Submandibular glands and parotid glands are symmetric and within normal limits. Thyroid: Negative. Lymph nodes: Bilateral cervical lymph nodes are within normal limits. No focal inflammation identified. Vascular: Major vascular flow voids in the neck and at the skull base are patent. There is mild carotid tortuosity. Limited intracranial: Negative. Visualized orbits: Negative. Mastoids and visualized paranasal sinuses: Minor mucosal thickening in the right maxillary sinus including trace bubbly opacity, but the remaining visible paranasal sinuses and mastoids are clear. Skeleton: Reversal of cervical lordosis, but otherwise negative CT appearance of the cervical spine. There is partially visible moderate thoracic scoliosis with thoracic  posterior spinal rods in place. No acute osseous abnormality identified. Upper chest: Streak artifact in some of the upper chest from the thoracic spinal hardware. Negative visible superior mediastinum. Normal trachea and carina. Negative visible upper lungs. Visible axillary lymph nodes are normal. IMPRESSION: 1. Essentially negative CT appearance of the neck: No convincing pharyngeal or laryngeal edema. No airway abnormality. No neck soft tissue inflammation or lymphadenopathy. There is trace gas in the left  parapharyngeal space of unclear significance, might be postoperative. 2. Trace right maxillary sinus disease. 3. Thoracic scoliosis with posterior spinal rods. Electronically Signed   By: Genevie Ann M.D.   On: 05/24/2018 15:45   ____________________________________________    PROCEDURES  Procedure(s) performed: None  Procedures  Critical Care performed: None  ____________________________________________   INITIAL IMPRESSION / ASSESSMENT AND PLAN / ED COURSE  Pertinent labs & imaging results that were available during my care of the patient were reviewed by me and considered in my medical decision making (see chart for details).  patient here with postoperative pain, no evidence of significant bleeding,ET scan is quite reassuring hemoglobin is reassuring kidney function is preserved, we are giving her IV fluid here she feels somewhat better I've ordered Decadron and I will talk to ENT  ----------------------------------------- 6:52 PM on 05/24/2018 -----------------------------------------  Paging Dr. Tami Ribas for the second time    ____________________________________________   FINAL CLINICAL IMPRESSION(S) / ED DIAGNOSES  Final diagnoses:  None      This chart was dictated using voice recognition software.  Despite best efforts to proofread,  errors can occur which can change meaning.      Schuyler Amor, MD 05/24/18 Virl Cagey    Schuyler Amor, MD 05/24/18 [phone]

## 2018-05-24 NOTE — Discharge Instructions (Addendum)
request of the surgeon, Dr. Tami Ribas, we have given you prednisone andsteroids here which should greatly helped urine formation. Drink plenty of fluids as possible at home. If you cannot swallow fluids after the steroids kick and of course return to the emergency room. If you have fever, increased swelling,  trouble breathing or you feel worse in any way return to the ER. Call your ent tomorrow for appointment. I am giving you a prescription for medication for nausea which should allow you to take the pain medications as prescribed.

## 2018-05-24 NOTE — ED Triage Notes (Signed)
Pt has tonsils taken out on the 18th and is unable to swallow fluids. She is complaining of a headache, she is nauseated. Her throat is extremely red and she has muffled speech.

## 2018-07-21 IMAGING — CT CT ABD-PELV W/ CM
2 of 4 series · 16 of 46 positions shown, 18 images · IV contrast (ISOVUE)
Comparison: None.

CLINICAL DATA: 30-year-old female with right lower quadrant
abdominal pain.

EXAM:
CT ABDOMEN AND PELVIS WITH CONTRAST
TECHNIQUE: Multidetector CT imaging of the abdomen and pelvis was performed
using the standard protocol following bolus administration of
intravenous contrast.
CONTRAST:  100mL 5VAFMM-FTT IOPAMIDOL (5VAFMM-FTT) INJECTION 61%

[Series 2: abd/pel with · axial · 0.83mm/px · z∈[-824,-414]mm · 13 of 92 slices shown, 15 images]
[im 5/92  soft-tissue]
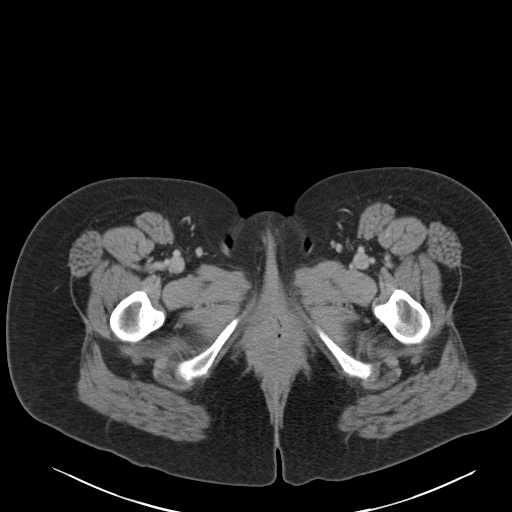
[im 5/92  bone]
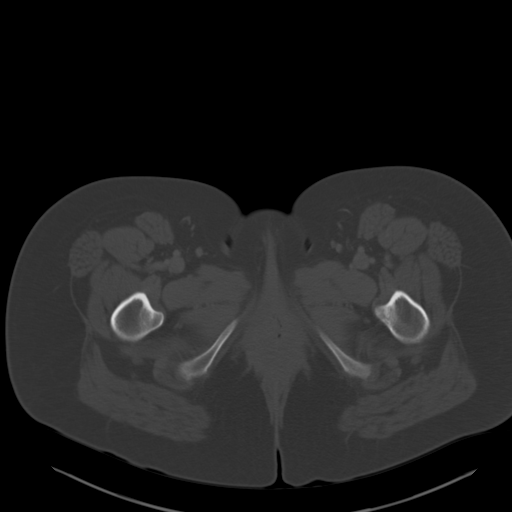
[im 14/92  soft-tissue]
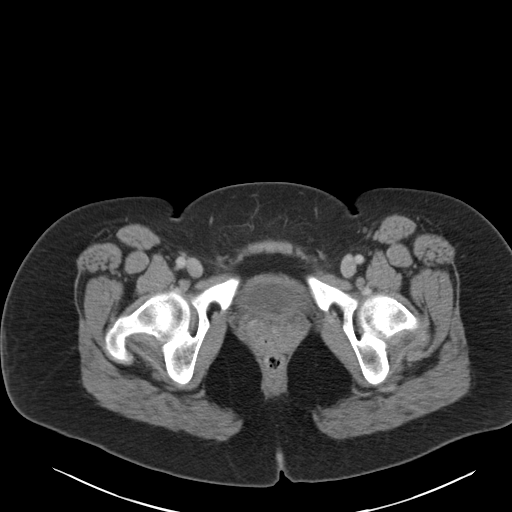
[im 19/92  soft-tissue]
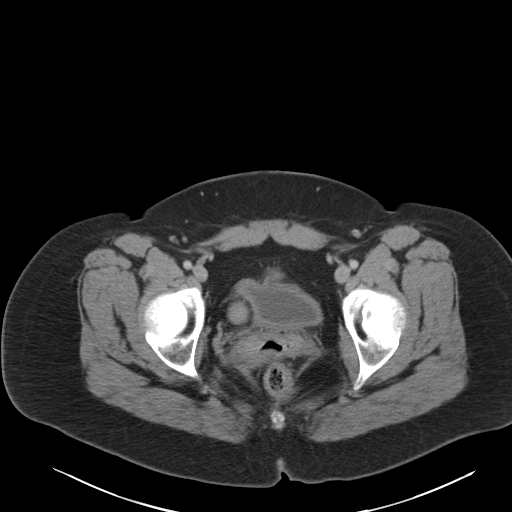
[im 28/92  soft-tissue]
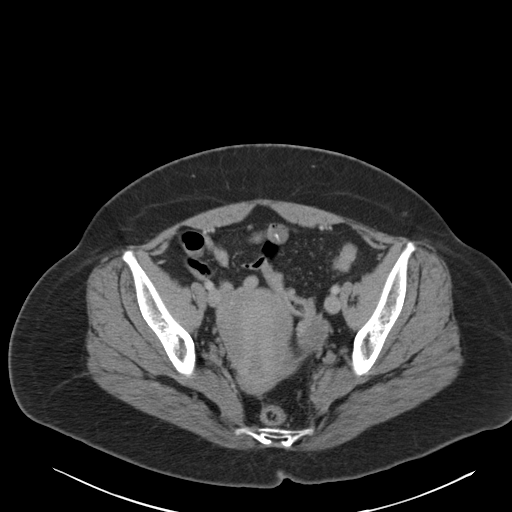
[im 32/92  soft-tissue]
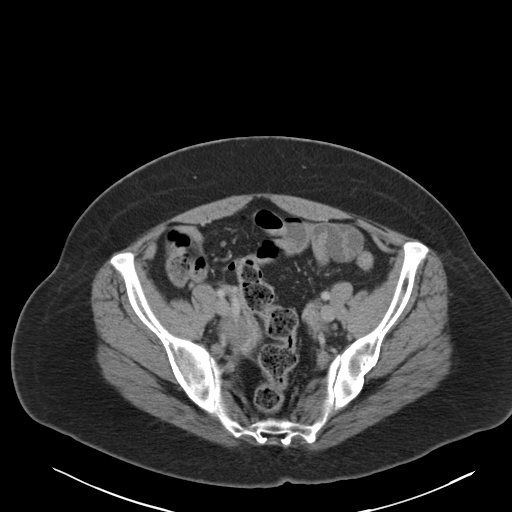
[im 41/92  soft-tissue]
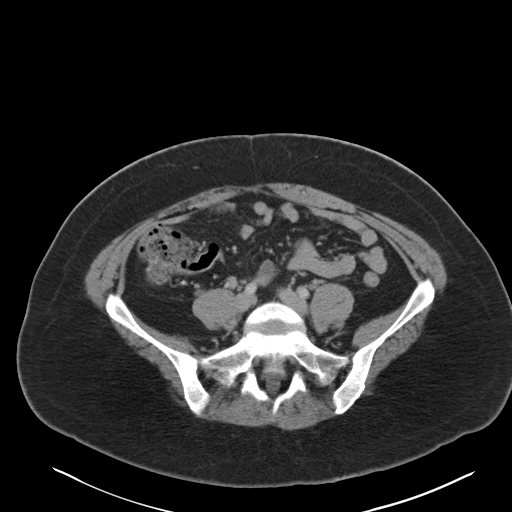
[im 46/92  soft-tissue]
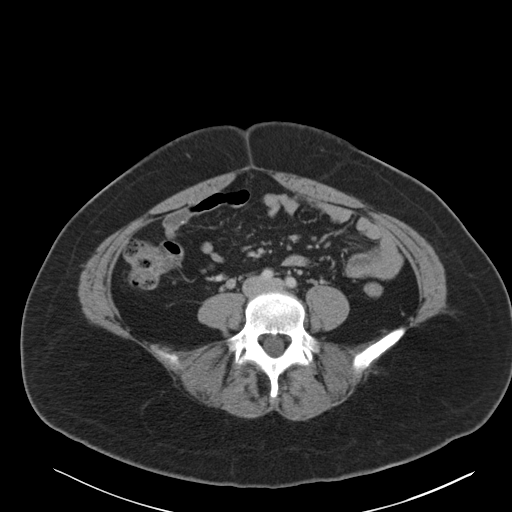
[im 51/92  soft-tissue]
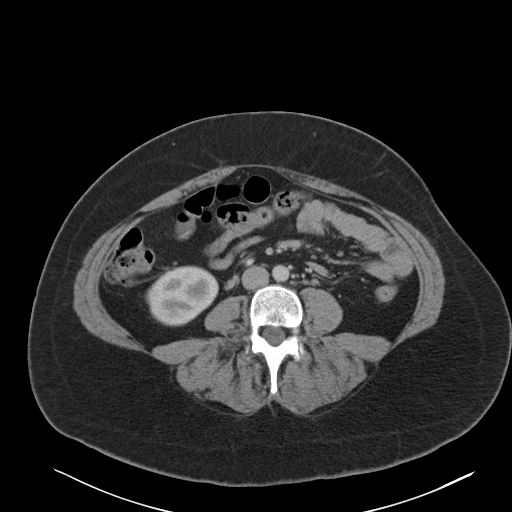
[im 60/92  soft-tissue]
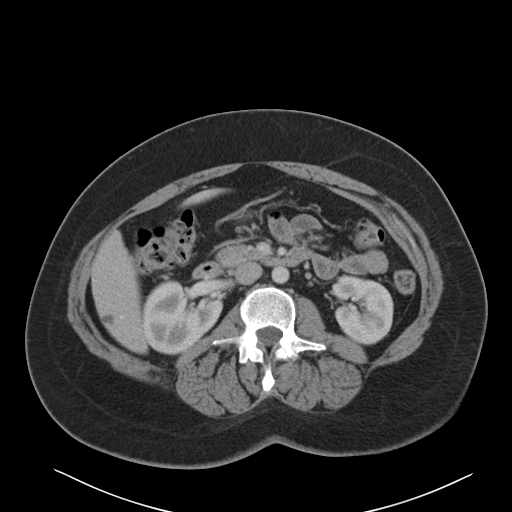
[im 60/92  bone]
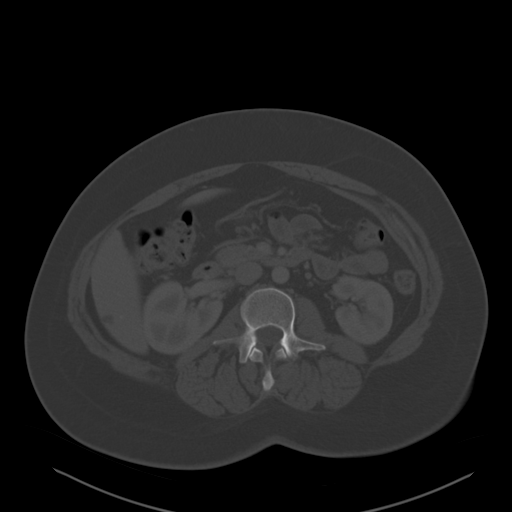
[im 64/92  soft-tissue]
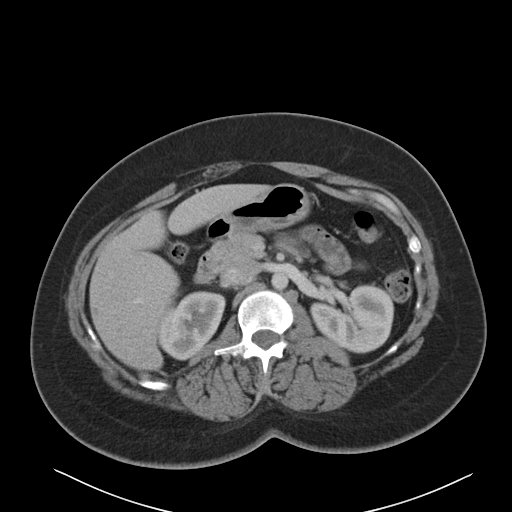
[im 73/92  soft-tissue]
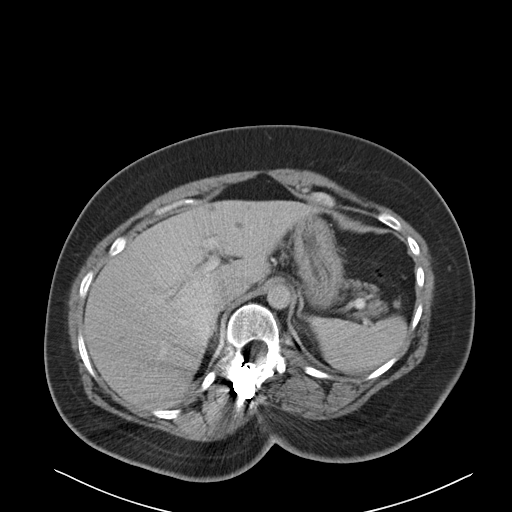
[im 78/92  soft-tissue]
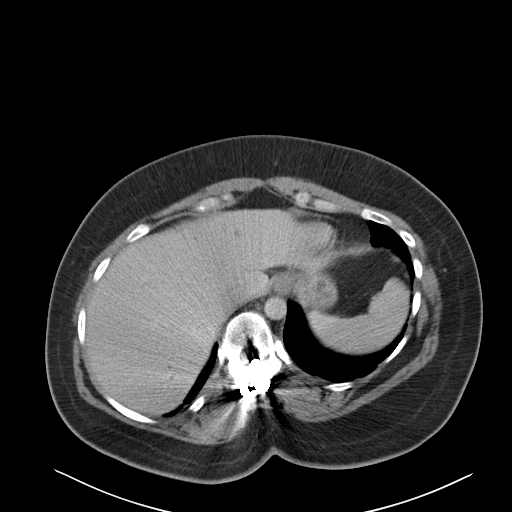
[im 87/92  soft-tissue]
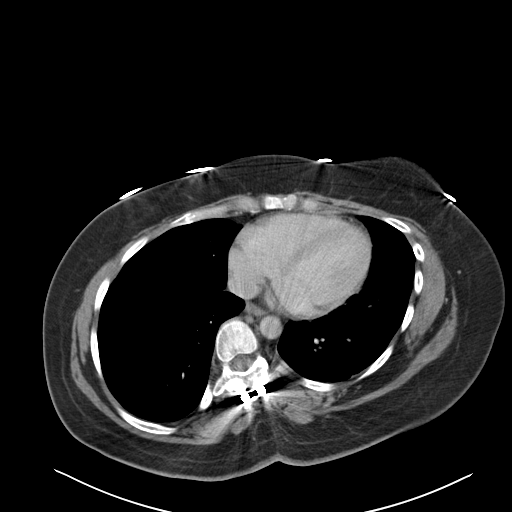

[Series 5: coronal a/|p · coronal · 0.74mm/px · 3 of 157 slices shown]
[im 53/157  soft-tissue]
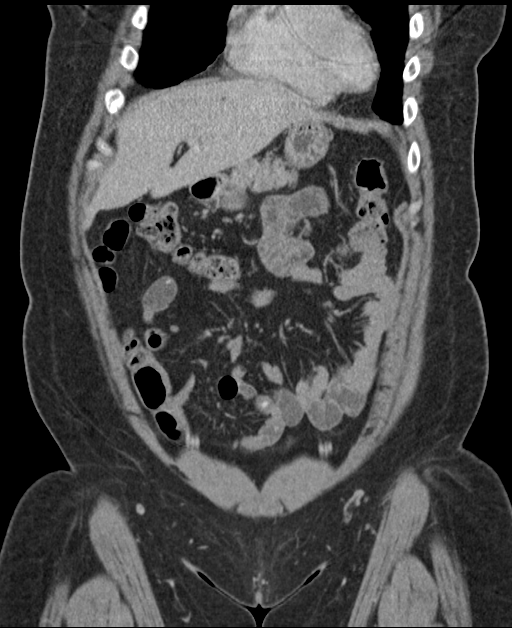
[im 70/157  soft-tissue]
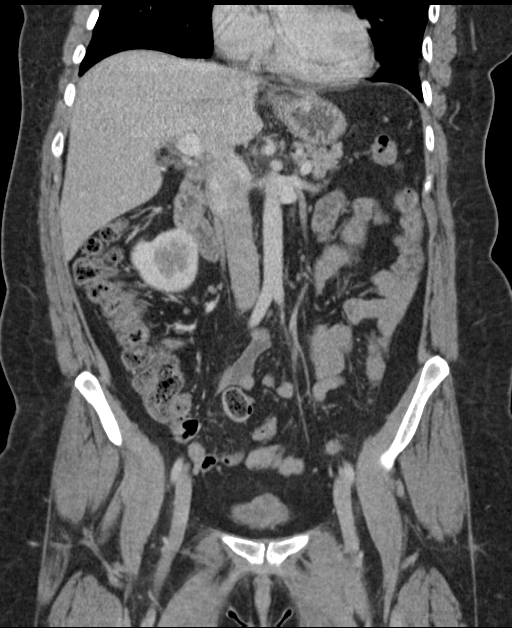
[im 87/157  soft-tissue]
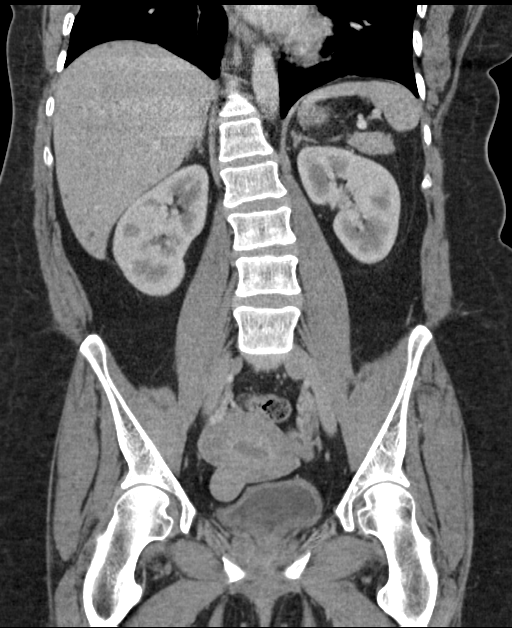

[16 of 46 positions shown; findings below may reference images not displayed]

FINDINGS: Evaluation is somewhat limited due to streak artifact caused by
spinal hardware.

Lower chest: The visualized lung bases are clear.

No intra-abdominal free air or free fluid.

Hepatobiliary: Small scattered hepatic hypodense lesions measuring
up to 8 mm are indeterminate but most likely represent cysts or
hemangioma. There is no intrahepatic biliary ductal dilatation. The
gallbladder is surgically absent.

Pancreas: Unremarkable. No pancreatic ductal dilatation or
surrounding inflammatory changes.

Spleen: Normal in size without focal abnormality.

Adrenals/Urinary Tract: The adrenal glands appear unremarkable. The
kidneys, visualized ureters appear unremarkable. The urinary bladder
is only partially distended. There is apparent diffuse thickening of
the bladder wall which may be partly related to underdistention.
Cystitis is not excluded. Correlation with urinalysis recommended.

Stomach/Bowel: Evaluation of the bowel is limited in the absence of
oral contrast. There is no evidence of bowel obstruction or active
inflammation. Thickened appearance of the descending colon most
likely related to underdistention. Normal appendix.

Vascular/Lymphatic: No significant vascular findings are present. No
enlarged abdominal or pelvic lymph nodes.

Reproductive: The uterus is anteverted. There is a 3.0 x 2.7 cm
exophytic fibroid from the anterior uterine body. The ovaries are
grossly unremarkable.

Other: None

Musculoskeletal: Scoliosis. Partially visualized Harrington fixation
rod along the distal thoracic spine. No acute fracture.
IMPRESSION: No acute intra-abdominopelvic pathology.  Normal appendix.

## 2018-08-29 ENCOUNTER — Ambulatory Visit (INDEPENDENT_AMBULATORY_CARE_PROVIDER_SITE_OTHER): Payer: BLUE CROSS/BLUE SHIELD | Admitting: Family Medicine

## 2018-08-29 ENCOUNTER — Encounter: Payer: Self-pay | Admitting: Family Medicine

## 2018-08-29 VITALS — BP 100/70 | HR 80 | Temp 97.9°F | Resp 16 | Ht 66.5 in | Wt 207.3 lb

## 2018-08-29 DIAGNOSIS — Z130 Encounter for screening for diseases of the blood and blood-forming organs and certain disorders involving the immune mechanism: Secondary | ICD-10-CM | POA: Diagnosis not present

## 2018-08-29 DIAGNOSIS — Z13220 Encounter for screening for lipoid disorders: Secondary | ICD-10-CM | POA: Diagnosis not present

## 2018-08-29 DIAGNOSIS — R5383 Other fatigue: Secondary | ICD-10-CM

## 2018-08-29 DIAGNOSIS — Z131 Encounter for screening for diabetes mellitus: Secondary | ICD-10-CM

## 2018-08-29 DIAGNOSIS — E669 Obesity, unspecified: Secondary | ICD-10-CM

## 2018-08-29 DIAGNOSIS — Z23 Encounter for immunization: Secondary | ICD-10-CM | POA: Diagnosis not present

## 2018-08-29 NOTE — Patient Instructions (Addendum)

## 2018-08-29 NOTE — Progress Notes (Signed)
Name: Anna Beck   MRN: [id number]    DOB: August 06, 1985   Date:08/29/2018       Progress Note  Subjective  Chief Complaint  Chief Complaint  Patient presents with  . Establish Care    HPI  Obesity: she sates gained weight 11 years after she delivered her first child, prior to last pregnancy in 2018 her weight was 218 lbs, she had hyperemesis gravidarum and at the time of her delivery her weight was down to 190 lbs in July 2019. She is nursing but continues to gain weight since nausea went away. She states she has a sweet tooth, she likes desserts and fruity candy , she also drinks regular Pepsi tow - three cans daily. She eats breakfast - oatmeal. She is skipping lunch. She is usually cooking dinner at home and eats out at most twice a week.    Fatigue: she has been feeling more tired than usual over the past month, no sob or weakness, no pica.No change in bowel movements  Past Surgical History:  Procedure Laterality Date  . CHOLECYSTECTOMY    . DG SCOLIOSIS STUDY ENTIRE SPINE ([place] HX)     States corrective surgery of scoliosis  . ROOT CANAL    . SMALL INTESTINE SURGERY  1986   Pyloric stenosis  . [place]  . TONSILLECTOMY Bilateral 05/18/2018   Procedure: TONSILLECTOMY;  Surgeon: Beverly Gust, MD;  Location: [facility];  Service: ENT;  Laterality: Bilateral;    Family History  Problem Relation Age of Onset  . Hypertension Mother   . Depression Mother   . Alcohol abuse Father   . Asthma Sister   . Heart disease Maternal Grandmother   . Alzheimer's disease Paternal Grandmother   . Depression Sister     Social History   Socioeconomic History  . Marital status: Significant Other    Spouse name: Marya Amsler   . Number of children: 2  . Years of education: Not on file  . Highest education level: Master's degree (e.g., MA, MS, MEng, MEd, MSW, MBA)  Occupational History  . Not on file  Social Needs  . Financial resource strain: Not hard at all  .  Food insecurity:    Worry: Never true    Inability: Never true  . Transportation needs:    Medical: No    Non-medical: No  Tobacco Use  . Smoking status: Never Smoker  . Smokeless tobacco: Never Used  . Tobacco comment: age 70 "tried it, didn't like it"  Substance and Sexual Activity  . Alcohol use: Yes    Comment: Occasionally  . Drug use: No  . Sexual activity: Yes    Birth control/protection: Implant, None  Lifestyle  . Physical activity:    Days per week: 0 days    Minutes per session: 0 min  . Stress: Not at all  Relationships  . Social connections:    Talks on phone: More than three times a week    Gets together: More than three times a week    Attends religious service: More than 4 times per year    Active member of club or organization: No    Attends meetings of clubs or organizations: Never    Relationship status: Living with partner  . Intimate partner violence:    Fear of current or ex partner: No    Emotionally abused: No    Physically abused: No    Forced sexual activity: No  Other Topics Concern  .  Not on file  Social History Narrative   Lives with the father or her youngest child     Current Outpatient Medications:  .  Prenatal Vit-Fe Fumarate-FA (PRENATAL MULTIVITAMIN) TABS tablet, Take 1 tablet by mouth at bedtime., Disp: , Rfl:   Allergies  Allergen Reactions  . Percocet [Oxycodone-Acetaminophen] Itching    I personally reviewed active problem list, medication list, allergies, family history, social history with the patient/caregiver today.   ROS  Constitutional: Negative for fever or weight change.  Respiratory: Negative for cough and shortness of breath.   Cardiovascular: Negative for chest pain or palpitations.  Gastrointestinal: Negative for abdominal pain, no bowel changes.  Musculoskeletal: Negative for gait problem or joint swelling.  Skin: Negative for rash.  Neurological: Negative for dizziness or headache.  No other specific  complaints in a complete review of systems (except as listed in HPI above).  Objective  Vitals:   08/29/18 1039  BP: 100/70  Pulse: 80  Resp: 16  Temp: 97.9 F (36.6 C)  TempSrc: Oral  SpO2: 98%  Weight: 207 lb 4.8 oz (94 kg)  Height: 5' 6.5" (1.689 m)    Body mass index is 32.96 kg/m.  Physical Exam  Constitutional: Patient appears well-developed and well-nourished. Obese  No distress.  HEENT: head atraumatic, normocephalic, pupils equal and reactive to light,  neck supple, throat within normal limits Cardiovascular: Normal rate, regular rhythm and normal heart sounds.  No murmur heard. No BLE edema. Pulmonary/Chest: Effort normal and breath sounds normal. No respiratory distress. Abdominal: Soft.  There is no tenderness. Psychiatric: Patient has a normal mood and affect. behavior is normal. Judgment and thought content normal.   PHQ2/9: Depression screen PHQ 2/9 08/29/2018  Decreased Interest 0  Down, Depressed, Hopeless 0  PHQ - 2 Score 0     Fall Risk: Fall Risk  08/29/2018 08/07/2017  Falls in the past year? 0 No  Number falls in past yr: 0 -  Injury with Fall? 0 -     Assessment & Plan   1. Obesity (BMI 30.0-34.9)  - COMPLETE METABOLIC PANEL WITH GFR  2. Flu vaccine need  - Flu Vaccine QUAD 36+ mos IM  3. Diabetes mellitus screening  - Hemoglobin A1c  4. Lipid screening  - Lipid panel  5. Screening for deficiency anemia  - CBC with Differential/Platelet  6. Other fatigue  - TSH - VITAMIN D 25 Hydroxy (Vit-D Deficiency, Fractures) - B12

## 2018-08-30 LAB — COMPLETE METABOLIC PANEL WITH GFR
AG RATIO: 1.6 (calc) (ref 1.0–2.5)
ALBUMIN MSPROF: 4.5 g/dL (ref 3.6–5.1)
ALT: 12 U/L (ref 6–29)
AST: 14 U/L (ref 10–30)
Alkaline phosphatase (APISO): 79 U/L (ref 33–115)
BILIRUBIN TOTAL: 0.4 mg/dL (ref 0.2–1.2)
BUN: 9 mg/dL (ref 7–25)
CHLORIDE: 105 mmol/L (ref 98–110)
CO2: 27 mmol/L (ref 20–32)
Calcium: 10.1 mg/dL (ref 8.6–10.2)
Creat: 0.6 mg/dL (ref 0.50–1.10)
GFR, EST AFRICAN AMERICAN: 138 mL/min/{1.73_m2} (ref >=60)
GFR, Est Non African American: 119 mL/min/{1.73_m2} (ref >=60)
Globulin: 2.9 g/dL (calc) (ref 1.9–3.7)
Glucose, Bld: 84 mg/dL (ref 65–139)
POTASSIUM: 4.2 mmol/L (ref 3.5–5.3)
Sodium: 141 mmol/L (ref 135–146)
TOTAL PROTEIN: 7.4 g/dL (ref 6.1–8.1)

## 2018-08-30 LAB — HEMOGLOBIN A1C
Hgb A1c MFr Bld: 5.3 % of total Hgb (ref <5.7)
Mean Plasma Glucose: 105 (calc)
eAG (mmol/L): 5.8 (calc)

## 2018-08-30 LAB — CBC WITH DIFFERENTIAL/PLATELET
Absolute Monocytes: 353 cells/uL (ref 200–950)
BASOS ABS: 31 {cells}/uL (ref 0–200)
Basophils Relative: 0.5 %
Eosinophils Absolute: 50 cells/uL (ref 15–500)
Eosinophils Relative: 0.8 %
HCT: 41.8 % (ref 35.0–45.0)
Hemoglobin: 13.7 g/dL (ref 11.7–15.5)
Lymphs Abs: 1748 cells/uL (ref 850–3900)
MCH: 28.8 pg (ref 27.0–33.0)
MCHC: 32.8 g/dL (ref 32.0–36.0)
MCV: 88 fL (ref 80.0–100.0)
MONOS PCT: 5.7 %
MPV: 9.8 fL (ref 7.5–12.5)
NEUTROS PCT: 64.8 %
Neutro Abs: 4018 cells/uL (ref 1500–7800)
PLATELETS: 289 10*3/uL (ref 140–400)
RBC: 4.75 10*6/uL (ref 3.80–5.10)
RDW: 11.9 % (ref 11.0–15.0)
Total Lymphocyte: 28.2 %
WBC: 6.2 10*3/uL (ref 3.8–10.8)

## 2018-08-30 LAB — LIPID PANEL
Cholesterol: 161 mg/dL (ref <200)
HDL: 50 mg/dL — AB (ref >50)
LDL Cholesterol (Calc): 90 mg/dL (calc)
NON-HDL CHOLESTEROL (CALC): 111 mg/dL (ref <130)
TRIGLYCERIDES: 112 mg/dL (ref <150)
Total CHOL/HDL Ratio: 3.2 (calc) (ref <5.0)

## 2018-08-30 LAB — VITAMIN B12: Vitamin B-12: 469 pg/mL (ref 200–1100)

## 2018-08-30 LAB — VITAMIN D 25 HYDROXY (VIT D DEFICIENCY, FRACTURES): VIT D 25 HYDROXY: 19 ng/mL — AB (ref 30–100)

## 2018-08-30 LAB — TSH: TSH: 0.94 m[IU]/L

## 2018-10-19 ENCOUNTER — Telehealth: Payer: BLUE CROSS/BLUE SHIELD | Admitting: Family

## 2018-10-19 DIAGNOSIS — N760 Acute vaginitis: Secondary | ICD-10-CM

## 2018-10-19 DIAGNOSIS — B9689 Other specified bacterial agents as the cause of diseases classified elsewhere: Secondary | ICD-10-CM

## 2018-10-19 MED ORDER — METRONIDAZOLE 0.75 % VA GEL: 1.0000 | Freq: Every day | VAGINAL | 0 refills | Status: DC

## 2018-10-19 NOTE — Progress Notes (Signed)
We are sorry that you are not feeling well. Here is how we plan to help! Based on what you shared with me it looks like you: May have a vaginosis due to bacteria  Vaginosis is an inflammation of the vagina that can result in discharge, itching and pain. The cause is usually a change in the normal balance of vaginal bacteria or an infection. Vaginosis can also result from reduced estrogen levels after menopause.  The most common causes of vaginosis are:   Bacterial vaginosis which results from an overgrowth of one on several organisms that are normally present in your vagina.   Yeast infections which are caused by a naturally occurring fungus called candida.   Vaginal atrophy (atrophic vaginosis) which results from the thinning of the vagina from reduced estrogen levels after menopause.   Trichomoniasis which is caused by a parasite and is commonly transmitted by sexual intercourse.  Factors that increase your risk of developing vaginosis include: Marland Kitchen Medications, such as antibiotics and steroids . Uncontrolled diabetes . Use of hygiene products such as bubble bath, vaginal spray or vaginal deodorant . Douching . Wearing damp or tight-fitting clothing . Using an intrauterine device (IUD) for birth control . Hormonal changes, such as those associated with pregnancy, birth control pills or menopause . Sexual activity . Having a sexually transmitted infection  Your treatment plan is  Metrogel Vaginal suppository nightly x 5 nights  Be sure to take all of the medication as directed. Stop taking any medication if you develop a rash, tongue swelling or shortness of breath. Mothers who are breast feeding should consider pumping and discarding their breast milk while on these antibiotics. However, there is no consensus that infant exposure at these doses would be harmful.  Remember that medication creams can weaken latex condoms. Marland Kitchen   HOME CARE:  Good hygiene may prevent some types of  vaginosis from recurring and may relieve some symptoms:  . Avoid baths, hot tubs and whirlpool spas. Rinse soap from your outer genital area after a shower, and dry the area well to prevent irritation. Don't use scented or harsh soaps, such as those with deodorant or antibacterial action. Marland Kitchen Avoid irritants. These include scented tampons and pads. . Wipe from front to back after using the toilet. Doing so avoids spreading fecal bacteria to your vagina.  Other things that may help prevent vaginosis include:  Marland Kitchen Don't douche. Your vagina doesn't require cleansing other than normal bathing. Repetitive douching disrupts the normal organisms that reside in the vagina and can actually increase your risk of vaginal infection. Douching won't clear up a vaginal infection. . Use a latex condom. Both female and female latex condoms may help you avoid infections spread by sexual contact. . Wear cotton underwear. Also wear pantyhose with a cotton crotch. If you feel comfortable without it, skip wearing underwear to bed. Yeast thrives in [employer] Your symptoms should improve in the next day or two.  GET HELP RIGHT AWAY IF:  . You have pain in your lower abdomen ( pelvic area or over your ovaries) . You develop nausea or vomiting . You develop a fever . Your discharge changes or worsens . You have persistent pain with intercourse . You develop shortness of breath, a rapid pulse, or you faint.  These symptoms could be signs of problems or infections that need to be evaluated by a medical provider now.  MAKE SURE YOU    Understand these instructions.  Will watch your condition.  Will  get help right away if you are not doing well or get worse.  Your e-visit answers were reviewed by a board certified advanced clinical practitioner to complete your personal care plan. Depending upon the condition, your plan could have included both over the counter or prescription medications. Please review your  pharmacy choice to make sure that you have choses a pharmacy that is open for you to pick up any needed prescription, Your safety is important to [place]. If you have drug allergies check your prescription carefully.   You can use MyChart to ask questions about today's visit, request a non-urgent call back, or ask for a work or school excuse for 24 hours related to this e-Visit. If it has been greater than 24 hours you will need to follow up with your provider, or enter a new e-Visit to address those concerns. You will get a MyChart message within the next two days asking about your experience. I hope that your e-visit has been valuable and will speed your recovery.

## 2018-10-24 ENCOUNTER — Encounter: Payer: Self-pay | Admitting: Family Medicine

## 2018-10-26 ENCOUNTER — Telehealth: Payer: Self-pay

## 2018-10-26 NOTE — Telephone Encounter (Signed)
Pt is trying to set up an appt.  She delivered in July; dx'd c fibroid; has had 3 cycles since the end of Feb. - the last on lasted 10d.  She is concerned.  Also has unusual blood clots.  [phone]  Given that we cannot adv this pt b/c we haven't seen her before and COVID-19,  I called c TN to see if we could schedule her an appt.  The phone msg was tx'd to TN's phone.  She will call pt and schedule.

## 2018-10-31 ENCOUNTER — Ambulatory Visit (INDEPENDENT_AMBULATORY_CARE_PROVIDER_SITE_OTHER): Payer: BLUE CROSS/BLUE SHIELD | Admitting: Obstetrics & Gynecology

## 2018-10-31 ENCOUNTER — Encounter: Payer: Self-pay | Admitting: Obstetrics & Gynecology

## 2018-10-31 ENCOUNTER — Other Ambulatory Visit (HOSPITAL_COMMUNITY)
Admission: RE | Admit: 2018-10-31 | Discharge: 2018-10-31 | Disposition: A | Discharge status: Home / Self Care | Payer: BLUE CROSS/BLUE SHIELD | Source: Ambulatory Visit | Attending: Obstetrics & Gynecology | Admitting: Obstetrics & Gynecology

## 2018-10-31 ENCOUNTER — Other Ambulatory Visit: Payer: Self-pay

## 2018-10-31 VITALS — BP 100/60 | Ht 66.0 in | Wt 218.0 lb

## 2018-10-31 DIAGNOSIS — N921 Excessive and frequent menstruation with irregular cycle: Secondary | ICD-10-CM | POA: Diagnosis not present

## 2018-10-31 DIAGNOSIS — Z86018 Personal history of other benign neoplasm: Secondary | ICD-10-CM

## 2018-10-31 DIAGNOSIS — R87610 Atypical squamous cells of undetermined significance on cytologic smear of cervix (ASC-US): Secondary | ICD-10-CM | POA: Diagnosis not present

## 2018-10-31 NOTE — Progress Notes (Signed)
HPI:      [name] is a 33 y.o. [id number] who LMP was Patient's last menstrual period was 10/26/2018., presents today for a problem visit.  She complains of metrorrhagia (no pattern) that  began Sep 22, 2018 and its severity is described as severe.  She has had a NSVD in 01/2018 followed by placement of Nexplanon at [facility].  Prior to pregnancy, last preg was 11 years prior and she used OCP briefly but had nausea SE and so used no BC for years. Breast feeding but much less so now. Periods are associated with moderate menstrual cramping.  She has used the following for attempts at control: maxi pad.  Previous evaluation: No recent eval.  Prior Diagnosis: uterine fibroids.  Noted in pregnancy only.  No follow up. Previous Treatment: none.  She had ASCUS PAP HPV NEG in 07/2017, no follow up PAP yet.  PMHx: She  has a past medical history of Anemia, Fibroid uterus, Scoliosis, Sickle cell trait ([place]), and Trichomonas infection. Also,  has a past surgical history that includes Cholecystectomy; DG SCOLIOSIS STUDY ENTIRE SPINE ([place] HX); Root canal; Tonsillectomy (Bilateral, 05/18/2018); Small intestine surgery (1986); and Spine surgery (1997)., family history includes Alcohol abuse in her father; Alzheimer's disease in her paternal grandmother; Asthma in her sister; Depression in her mother and sister; Heart disease in her maternal grandmother; Hypertension in her mother.,  reports that she has never smoked. She has never used smokeless tobacco. She reports current alcohol use. She reports that she does not use drugs.  She  Current Outpatient Medications:  .  metroNIDAZOLE (METROGEL VAGINAL) 0.75 % vaginal gel, Place 1 Applicatorful vaginally at bedtime. (Patient not taking: Reported on 10/31/2018), Disp: 70 g, Rfl: 0 .  Prenatal Vit-Fe Fumarate-FA (PRENATAL MULTIVITAMIN) TABS tablet, Take 1 tablet by mouth at bedtime., Disp: , Rfl:   Also, is allergic to percocet [oxycodone-acetaminophen].   Review of Systems  Constitutional: Positive for malaise/fatigue. Negative for chills and fever.  HENT: Negative for congestion, sinus pain and sore throat.   Eyes: Negative for blurred vision and pain.  Respiratory: Negative for cough and wheezing.   Cardiovascular: Negative for chest pain and leg swelling.  Gastrointestinal: Negative for abdominal pain, constipation, diarrhea, heartburn, nausea and vomiting.  Genitourinary: Negative for dysuria, frequency, hematuria and urgency.  Musculoskeletal: Negative for back pain, joint pain, myalgias and neck pain.  Skin: Negative for itching and rash.  Neurological: Negative for dizziness, tremors and weakness.  Endo/Heme/Allergies: Does not bruise/bleed easily.  Psychiatric/Behavioral: Negative for depression. The patient is not nervous/anxious and does not have insomnia.     Objective: BP 100/60   Ht 5\' 6"  (1.676 m)   Wt 218 lb (98.9 kg)   LMP 10/26/2018   BMI 35.19 kg/m  Physical Exam Constitutional:      General: She is not in acute distress.    Appearance: She is well-developed.  Genitourinary:     Pelvic exam was performed with patient supine.     Vagina, uterus and rectum normal.     No lesions in the vagina.     No vaginal bleeding.     No cervical motion tenderness, friability, lesion or polyp.     Uterus is mobile.     Uterus is not enlarged.     No uterine mass detected.    Uterus is midaxial.     No right or left adnexal mass present.     Right adnexa not tender.  Left adnexa not tender.  HENT:     Head: Normocephalic and atraumatic. No laceration.     Right Ear: Hearing normal.     Left Ear: Hearing normal.     Mouth/Throat:     Pharynx: Uvula midline.  Eyes:     Pupils: Pupils are equal, round, and reactive to light.  Neck:     Musculoskeletal: Normal range of motion and neck supple.     Thyroid: No thyromegaly.  Cardiovascular:     Rate and Rhythm: Normal rate and regular rhythm.     Heart sounds: No  murmur. No friction rub. No gallop.   Pulmonary:     Effort: Pulmonary effort is normal. No respiratory distress.     Breath sounds: Normal breath sounds. No wheezing.  Abdominal:     General: Bowel sounds are normal. There is no distension.     Palpations: Abdomen is soft.     Tenderness: There is no abdominal tenderness. There is no rebound.  Musculoskeletal: Normal range of motion.  Neurological:     Mental Status: She is alert and oriented to person, place, and time.     Cranial Nerves: No cranial nerve deficit.  Skin:    General: Skin is warm and dry.  Psychiatric:        Judgment: Judgment normal.  Vitals signs reviewed.     ASSESSMENT/PLAN:  menometrorrhagia and h/o fibroids  Problem List Items Addressed This Visit      Other   Menometrorrhagia - Primary   Relevant Orders   CBC   US PELVIC COMPLETE WITH TRANSVAGINAL   History of uterine fibroid   Relevant Orders   US PELVIC COMPLETE WITH TRANSVAGINAL    Other Visit Diagnoses    ASCUS of cervix with negative high risk HPV       Relevant Orders   Cytology - PAP    Patient has abnormal uterine bleeding . She has a normal exam today, with no evidence of lesions.  Evaluation includes the following: exam, labs such as hormonal testing, and pelvic ultrasound to evaluate for any structural gynecologic abnormalities.  Patient to follow up after testing.  Treatment option for menorrhagia or menometrorrhagia discussed in great detail with the patient.  Options include hormonal therapy, IUD therapy such as Mirena, D&C, Ablation, and Hysterectomy.  The pros and cons of each option discussed with patient.  Provera for extended bleeding planned.  Has Nexplanon, may need over lap in meds as an option.  She is finishing up breast feeding.  Uncertain about future fertility.  Fibroid treatment such as [place], Lupron, Myomectomy, and Hysterectomy discussed in detail, with the pros and cons of each choice counseled.  No treatment as an  option also discussed, as well as control of symptoms alone with hormone therapy. Information provided to the patient.  Barnett Applebaum, MD, [facility], [facility] 10/31/2018  2:05 PM

## 2018-10-31 NOTE — Patient Instructions (Signed)

## 2018-11-01 LAB — CBC
Hematocrit: 39.1 % (ref 34.0–46.6)
Hemoglobin: 13.5 g/dL (ref 11.1–15.9)
MCH: 30.7 pg (ref 26.6–33.0)
MCHC: 34.5 g/dL (ref 31.5–35.7)
MCV: 89 fL (ref 79–97)
Platelets: 275 10*3/uL (ref 150–450)
RBC: 4.4 x10E6/uL (ref 3.77–5.28)
RDW: 12.4 % (ref 11.7–15.4)
WBC: 7.5 10*3/uL (ref 3.4–10.8)

## 2018-11-02 LAB — CYTOLOGY - PAP
Diagnosis: UNDETERMINED — AB
HPV: NOT DETECTED

## 2018-11-08 ENCOUNTER — Other Ambulatory Visit: Payer: Self-pay | Admitting: Obstetrics & Gynecology

## 2018-11-08 ENCOUNTER — Other Ambulatory Visit: Payer: Self-pay

## 2018-11-08 ENCOUNTER — Ambulatory Visit (INDEPENDENT_AMBULATORY_CARE_PROVIDER_SITE_OTHER): Payer: BLUE CROSS/BLUE SHIELD

## 2018-11-08 DIAGNOSIS — Z86018 Personal history of other benign neoplasm: Secondary | ICD-10-CM

## 2018-11-08 DIAGNOSIS — D259 Leiomyoma of uterus, unspecified: Secondary | ICD-10-CM

## 2018-11-08 DIAGNOSIS — N921 Excessive and frequent menstruation with irregular cycle: Secondary | ICD-10-CM | POA: Diagnosis not present

## 2018-11-08 NOTE — Progress Notes (Signed)
Review of ULTRASOUND.    I have personally reviewed images and report of recent ultrasound done at [facility].    Plan of management to be discussed with patient.  Fibroid is smaller, and located where it is not likely to be cause of her bleeding. Recommend cont w Nexplanon, overlap as needed w Provera or other, or even take it out, if continued bleeding occurs.  Barnett Applebaum, MD, [facility], [facility] 11/08/2018  4:52 PM

## 2018-11-25 ENCOUNTER — Encounter: Payer: Self-pay | Admitting: Family Medicine

## 2018-11-28 ENCOUNTER — Encounter: Payer: Self-pay | Admitting: Family Medicine

## 2018-11-28 ENCOUNTER — Ambulatory Visit (INDEPENDENT_AMBULATORY_CARE_PROVIDER_SITE_OTHER): Payer: BLUE CROSS/BLUE SHIELD | Admitting: Family Medicine

## 2018-11-28 ENCOUNTER — Other Ambulatory Visit: Payer: Self-pay

## 2018-11-28 VITALS — BP 100/80 | HR 82 | Temp 97.9°F | Resp 16 | Ht 66.5 in | Wt 215.5 lb

## 2018-11-28 DIAGNOSIS — M5432 Sciatica, left side: Secondary | ICD-10-CM | POA: Diagnosis not present

## 2018-11-28 MED ORDER — DICLOFENAC SODIUM 75 MG PO TBEC: 75.0000 mg | DELAYED_RELEASE_TABLET | Freq: Two times a day (BID) | ORAL | 0 refills | Status: DC

## 2018-11-28 MED ORDER — CYCLOBENZAPRINE HCL 10 MG PO TABS: 10.0000 mg | ORAL_TABLET | Freq: Every day | ORAL | 0 refills | Status: DC

## 2018-11-28 NOTE — Progress Notes (Signed)
Name: Anna Beck   MRN: [id number]    DOB: 11-24-1985   Date:11/28/2018       Progress Note  Subjective  Chief Complaint  Chief Complaint  Patient presents with  . Hip Pain    left hip pain    HPI  Sciatica left side: started suddenly without injury one week ago. Pain is on left sciatic notch and radiates to left outer hip down to her left foot, pain is described as shooting intense with certain movements or pressure, and causes tingling and numbness down her leg. No bowel or bladder incontinence, no trauma that she recalls but has a 95 month old daughter. No rashes.    Patient Active Problem List   Diagnosis Date Noted  . Menometrorrhagia 10/31/2018  . History of uterine fibroid 10/31/2018    Past Surgical History:  Procedure Laterality Date  . CHOLECYSTECTOMY    . DG SCOLIOSIS STUDY ENTIRE SPINE ([place] [facility])     States corrective surgery of scoliosis  . ROOT CANAL    . SMALL INTESTINE SURGERY  1986   Pyloric stenosis  . [place]  . TONSILLECTOMY Bilateral 05/18/2018   Procedure: TONSILLECTOMY;  Surgeon: Beverly Gust, MD;  Location: [place];  Service: ENT;  Laterality: Bilateral;    Family History  Problem Relation Age of Onset  . Hypertension Mother   . Depression Mother   . Alcohol abuse Father   . Asthma Sister   . Heart disease Maternal Grandmother   . Alzheimer's disease Paternal Grandmother   . Depression Sister     Social History   Socioeconomic History  . Marital status: Significant Other    Spouse name: Marya Amsler   . Number of children: 2  . Years of education: Not on file  . Highest education level: Master's degree (e.g., MA, MS, MEng, MEd, MSW, MBA)  Occupational History  . Not on file  Social Needs  . Financial resource strain: Not hard at all  . Food insecurity:    Worry: Never true    Inability: Never true  . Transportation needs:    Medical: No    Non-medical: No  Tobacco Use  . Smoking status: Never Smoker  .  Smokeless tobacco: Never Used  . Tobacco comment: age 48 "tried it, didn't like it"  Substance and Sexual Activity  . Alcohol use: Yes    Comment: Occasionally  . Drug use: No  . Sexual activity: Yes    Birth control/protection: Implant, None  Lifestyle  . Physical activity:    Days per week: 0 days    Minutes per session: 0 min  . Stress: Not at all  Relationships  . Social connections:    Talks on phone: More than three times a week    Gets together: More than three times a week    Attends religious service: More than 4 times per year    Active member of club or organization: No    Attends meetings of clubs or organizations: Never    Relationship status: Living with partner  . Intimate partner violence:    Fear of current or ex partner: No    Emotionally abused: No    Physically abused: No    Forced sexual activity: No  Other Topics Concern  . Not on file  Social History Narrative   Lives with the father or her youngest child     Current Outpatient Medications:  .  metroNIDAZOLE (METROGEL VAGINAL) 0.75 % vaginal  gel, Place 1 Applicatorful vaginally at bedtime. (Patient not taking: Reported on 10/31/2018), Disp: 70 g, Rfl: 0 .  Prenatal Vit-Fe Fumarate-FA (PRENATAL MULTIVITAMIN) TABS tablet, Take 1 tablet by mouth at bedtime., Disp: , Rfl:   Allergies  Allergen Reactions  . Percocet [Oxycodone-Acetaminophen] Itching    I personally reviewed active problem list, medication list, allergies, family history, social history with the patient/caregiver today.  Currently not working , secondary to [employer]   ROS  Ten systems reviewed and is negative except as mentioned in HPI   Objective  Vitals:   11/28/18 1127  BP: 100/80  Pulse: 82  Resp: 16  Temp: 97.9 F (36.6 C)  TempSrc: Oral  SpO2: 100%  Weight: 215 lb 8 oz (97.8 kg)  Height: 5' 6.5" (1.689 m)    Body mass index is 34.26 kg/m.  Physical Exam  Constitutional: Patient appears well-developed  and well-nourished. Obese  No distress.  HEENT: head atraumatic, normocephalic, pupils equal and reactive to light,  neck supple, throat within normal limits Cardiovascular: Normal rate, regular rhythm and normal heart sounds.  No murmur heard. No BLE edema. Pulmonary/Chest: Effort normal and breath sounds normal. No respiratory distress. Abdominal: Soft.  There is no tenderness. Muscular Skeletal: pain during palpation of left sciatic notch, negative straight leg raise, no pain during palpation of spine, normal rom of spine, scoliosis  Psychiatric: Patient has a normal mood and affect. behavior is normal. Judgment and thought content normal.  Recent Results (from the past 2160 hour(s))  Cytology - PAP     Status: Abnormal   Collection Time: 10/31/18 12:00 AM  Result Value Ref Range   Adequacy (A)     Satisfactory for evaluation  endocervical/transformation zone component PRESENT.   Diagnosis (A)     ATYPICAL SQUAMOUS CELLS OF UNDETERMINED SIGNIFICANCE (ASC-US).   HPV NOT DETECTED     Comment: Normal Reference Range - NOT Detected   Material Submitted CervicoVaginal Pap [ThinPrep Imaged] (A)    CYTOLOGY - PAP PAP RESULT   CBC     Status: None   Collection Time: 10/31/18  2:16 PM  Result Value Ref Range   WBC 7.5 3.4 - 10.8 x10E3/uL   RBC 4.40 3.77 - 5.28 x10E6/uL   Hemoglobin 13.5 11.1 - 15.9 g/dL   Hematocrit 39.1 34.0 - 46.6 %   MCV 89 79 - 97 fL   MCH 30.7 26.6 - 33.0 pg   MCHC 34.5 31.5 - 35.7 g/dL   RDW 12.4 11.7 - 15.4 %   Platelets 275 150 - 450 x10E3/uL     PHQ2/9: Depression screen [facility] 2/9 11/28/2018 08/29/2018  Decreased Interest 0 0  Down, Depressed, Hopeless 0 0  PHQ - 2 Score 0 0  Altered sleeping 0 -  Tired, decreased energy 0 -  Change in appetite 0 -  Feeling bad or failure about yourself  0 -  Trouble concentrating 0 -  Moving slowly or fidgety/restless 0 -  Suicidal thoughts 0 -  PHQ-9 Score 0 -    phq 9 is negative   Fall Risk: Fall Risk   11/28/2018 08/29/2018 08/07/2017  Falls in the past year? 0 0 No  Number falls in past yr: 0 0 -  Injury with Fall? 0 0 -    Functional Status Survey: Is the patient deaf or have difficulty hearing?: No Does the patient have difficulty seeing, even when wearing glasses/contacts?: No Does the patient have difficulty concentrating, remembering, or making decisions?: No Does the  patient have difficulty walking or climbing stairs?: Yes Does the patient have difficulty dressing or bathing?: No Does the patient have difficulty doing errands alone such as visiting a doctor's office or shopping?: No    Assessment & Plan   1. Sciatica of left side  Discussed possible side effects of medication, needs to take with food, monitor for bowel and bladder incontinence, may consider seeing chiropractor  - diclofenac (VOLTAREN) 75 MG EC tablet; Take 1 tablet (75 mg total) by mouth 2 (two) times daily.  Dispense: 30 tablet; Refill: 0 - cyclobenzaprine (FLEXERIL) 10 MG tablet; Take 1 tablet (10 mg total) by mouth at bedtime.  Dispense: 30 tablet; Refill: 0

## 2018-11-28 NOTE — Patient Instructions (Signed)
Sciatica    Sciatica is pain, numbness, weakness, or tingling along the path of the sciatic nerve. The sciatic nerve starts in the lower back and runs down the back of each leg. The nerve controls the muscles in the lower leg and in the back of the knee. It also provides feeling (sensation) to the back of the thigh, the lower leg, and the sole of the foot. Sciatica is a symptom of another medical condition that pinches or puts pressure on the sciatic nerve.  Generally, sciatica only affects one side of the body. Sciatica usually goes away on its own or with treatment. In some cases, sciatica may keep coming back (recur).  What are the causes?  This condition is caused by pressure on the sciatic nerve, or pinching of the sciatic nerve. This may be the result of:  · A disk in between the bones of the spine (vertebrae) bulging out too far (herniated disk).  · Age-related changes in the spinal disks (degenerative disk disease).  · A pain disorder that affects a muscle in the buttock (piriformis syndrome).  · Extra bone growth (bone spur) near the sciatic nerve.  · An injury or break (fracture) of the pelvis.  · Pregnancy.  · Tumor (rare).  What increases the risk?  The following factors may make you more likely to develop this condition:  · Playing sports that place pressure or stress on the spine, such as football or weight lifting.  · Having poor strength and flexibility.  · A history of back injury.  · A history of back surgery.  · Sitting for long periods of time.  · Doing activities that involve repetitive bending or lifting.  · Obesity.  What are the signs or symptoms?  Symptoms can vary from mild to very severe, and they may include:  · Any of these problems in the lower back, leg, hip, or buttock:  ? Mild tingling or dull aches.  ? Burning sensations.  ? Sharp pains.  · Numbness in the back of the calf or the sole of the foot.  · Leg weakness.  · Severe back pain that makes movement difficult.  These symptoms  may get worse when you cough, sneeze, or laugh, or when you sit or stand for long periods of time. Being overweight may also make symptoms worse. In some cases, symptoms may recur over time.  How is this diagnosed?  This condition may be diagnosed based on:  · Your symptoms.  · A physical exam. Your health care provider may ask you to do certain movements to check whether those movements trigger your symptoms.  · You may have tests, including:  ? Blood tests.  ? X-rays.  ? MRI.  ? CT scan.  How is this treated?  In many cases, this condition improves on its own, without any treatment. However, treatment may include:  · Reducing or modifying physical activity during periods of pain.  · Exercising and stretching to strengthen your abdomen and improve the flexibility of your spine.  · Icing and applying heat to the affected area.  · Medicines that help:  ? To relieve pain and swelling.  ? To relax your muscles.  · Injections of medicines that help to relieve pain, irritation, and inflammation around the sciatic nerve (steroids).  · Surgery.  Follow these instructions at home:  Medicines  · Take over-the-counter and prescription medicines only as told by your health care provider.  · Do not drive or operate heavy   machinery while taking prescription pain medicine.  Managing pain  · If directed, apply ice to the affected area.  ? Put ice in a plastic bag.  ? Place a towel between your skin and the bag.  ? Leave the ice on for 20 minutes, 2-3 times a day.  · After icing, apply heat to the affected area before you exercise or as often as told by your health care provider. Use the heat source that your health care provider recommends, such as a moist heat pack or a heating pad.  ? Place a towel between your skin and the heat source.  ? Leave the heat on for 20-30 minutes.  ? Remove the heat if your skin turns bright red. This is especially important if you are unable to feel pain, heat, or cold. You may have a greater risk  of getting burned.  Activity  · Return to your normal activities as told by your health care provider. Ask your health care provider what activities are safe for you.  ? Avoid activities that make your symptoms worse.  · Take brief periods of rest throughout the day. Resting in a lying or standing position is usually better than sitting to rest.  ? When you rest for longer periods, mix in some mild activity or stretching between periods of rest. This will help to prevent stiffness and pain.  ? Avoid sitting for long periods of time without moving. Get up and move around at least one time each hour.  · Exercise and stretch regularly, as told by your health care provider.  · Do not lift anything that is heavier than 10 lb (4.5 kg) while you have symptoms of sciatica. When you do not have symptoms, you should still avoid heavy lifting, especially repetitive heavy lifting.  · When you lift objects, always use proper lifting technique, which includes:  ? Bending your knees.  ? Keeping the load close to your body.  ? Avoiding twisting.  General instructions  · Use good posture.  ? Avoid leaning forward while sitting.  ? Avoid hunching over while standing.  · Maintain a healthy weight. Excess weight puts extra stress on your back and makes it difficult to maintain good posture.  · Wear supportive, comfortable shoes. Avoid wearing high heels.  · Avoid sleeping on a mattress that is too soft or too hard. A mattress that is firm enough to support your back when you sleep may help to reduce your pain.  · Keep all follow-up visits as told by your health care provider. This is important.  Contact a health care provider if:  · You have pain that wakes you up when you are sleeping.  · You have pain that gets worse when you lie down.  · Your pain is worse than you have experienced in the past.  · Your pain lasts longer than 4 weeks.  · You experience unexplained weight loss.  Get help right away if:  · You lose control of your  bowel or bladder (incontinence).  · You have:  ? Weakness in your lower back, pelvis, buttocks, or legs that gets worse.  ? Redness or swelling of your back.  ? A burning sensation when you urinate.  This information is not intended to replace advice given to you by your health care provider. Make sure you discuss any questions you have with your health care provider.  Document Released: 07/12/2001 Document Revised: 12/22/2015 Document Reviewed: 03/27/2015  Elsevier Interactive Patient Education ©   2019 [employer]

## 2018-12-25 ENCOUNTER — Other Ambulatory Visit: Payer: Self-pay | Admitting: Family Medicine

## 2018-12-25 DIAGNOSIS — M5432 Sciatica, left side: Secondary | ICD-10-CM

## 2018-12-26 NOTE — Telephone Encounter (Signed)
Called patient to see how she was doing with back pain and inform her that medication was for acute use not for chronic use. No answer, lvm for her to call back and let us know how she is feeling.

## 2019-02-24 DIAGNOSIS — S92302A Fracture of unspecified metatarsal bone(s), left foot, initial encounter for closed fracture: Secondary | ICD-10-CM | POA: Diagnosis not present

## 2019-02-24 DIAGNOSIS — S8265XA Nondisplaced fracture of lateral malleolus of left fibula, initial encounter for closed fracture: Secondary | ICD-10-CM | POA: Diagnosis not present

## 2019-02-25 DIAGNOSIS — S8262XA Displaced fracture of lateral malleolus of left fibula, initial encounter for closed fracture: Secondary | ICD-10-CM | POA: Diagnosis not present

## 2019-02-25 DIAGNOSIS — S8263XA Displaced fracture of lateral malleolus of unspecified fibula, initial encounter for closed fracture: Secondary | ICD-10-CM | POA: Insufficient documentation

## 2019-02-25 DIAGNOSIS — S92353A Displaced fracture of fifth metatarsal bone, unspecified foot, initial encounter for closed fracture: Secondary | ICD-10-CM | POA: Insufficient documentation

## 2019-02-25 DIAGNOSIS — S92352A Displaced fracture of fifth metatarsal bone, left foot, initial encounter for closed fracture: Secondary | ICD-10-CM | POA: Diagnosis not present

## 2019-02-27 ENCOUNTER — Encounter: Payer: Self-pay | Admitting: Family Medicine

## 2019-02-27 ENCOUNTER — Other Ambulatory Visit: Payer: Self-pay

## 2019-02-27 ENCOUNTER — Ambulatory Visit (INDEPENDENT_AMBULATORY_CARE_PROVIDER_SITE_OTHER): Payer: BC Managed Care – PPO | Admitting: Family Medicine

## 2019-02-27 VITALS — BP 114/64 | HR 77 | Temp 97.1°F | Resp 16 | Ht 66.5 in | Wt 222.3 lb

## 2019-02-27 DIAGNOSIS — Z6835 Body mass index (BMI) 35.0-35.9, adult: Secondary | ICD-10-CM | POA: Insufficient documentation

## 2019-02-27 DIAGNOSIS — L509 Urticaria, unspecified: Secondary | ICD-10-CM

## 2019-02-27 DIAGNOSIS — R87610 Atypical squamous cells of undetermined significance on cytologic smear of cervix (ASC-US): Secondary | ICD-10-CM | POA: Insufficient documentation

## 2019-02-27 DIAGNOSIS — E559 Vitamin D deficiency, unspecified: Secondary | ICD-10-CM | POA: Insufficient documentation

## 2019-02-27 DIAGNOSIS — Z9181 History of falling: Secondary | ICD-10-CM

## 2019-02-27 NOTE — Progress Notes (Signed)
Name: Anna Beck   MRN: [id number]    DOB: Dec 04, 1985   Date:02/27/2019       Progress Note  Subjective  Chief Complaint  Chief Complaint  Patient presents with  . Medication Refill    6 month F/U  . Sciatica of left side  . Rash    When she starts to workout and sweat she will break out in a rash in bilateral legs that travels upwards and then on her stomach-burn, itchy underneath her skin and will turn red.  . Fall    HPI  Obesity: she sates gained weight 11 years after she delivered her first child, prior to last pregnancy in 2018 her weight was 218 lbs, she had hyperemesis gravidarum and at the time of her delivery her weight was down to 190 lbs in July 2019. She was seen in January and weight was trending up, she states since than she stopped having desserts, stopped drinking sodas, having cherios for breakfast and eats dinner at home but continues to gain weight. She is frustrated. Discussed keto diet for 2 weeks followed by a modified keto with about 50 g per day, increase fluid intake , may need to see dietician versus a bariatric center.   Exercise induced hives: she tried exercising, but even walking around the block makes her develop a rash from legs all the way up to her mid abdomen, itchy. She has tried benadryl without help, advised to take swimming lessons, since she states cold showers makes symptoms resolves   ASCUS: had pap done 10/2018 by Dr. . Kenton Kingfisher, HPV negative  Recent fall: fracture of left ankle and 5th metatarsal by missing a step this past Sunday, seeing [facility] she states pain is under better control now   Patient Active Problem List   Diagnosis Date Noted  . Vitamin D deficiency 02/27/2019  . BMI 35.0-35.9,adult 02/27/2019  . ASCUS of cervix with negative high risk HPV 02/27/2019  . Closed fracture of base of fifth metatarsal bone 02/25/2019  . Closed fracture of lateral malleolus 02/25/2019  . Menometrorrhagia 10/31/2018  . History of uterine  fibroid 10/31/2018    Past Surgical History:  Procedure Laterality Date  . CHOLECYSTECTOMY    . DG SCOLIOSIS STUDY ENTIRE SPINE ([place] [facility]X)     States corrective surgery of scoliosis  . ROOT CANAL    . SMALL INTESTINE SURGERY  1986   Pyloric stenosis  . [place]  . TONSILLECTOMY Bilateral 05/18/2018   Procedure: TONSILLECTOMY;  Surgeon: Beverly Gust, MD;  Location: [place];  Service: ENT;  Laterality: Bilateral;    Family History  Problem Relation Age of Onset  . Hypertension Mother   . Depression Mother   . Alcohol abuse Father   . Asthma Sister   . Heart disease Maternal Grandmother   . Alzheimer's disease Paternal Grandmother   . Depression Sister     Social History   Socioeconomic History  . Marital status: Significant Other    Spouse name: Marya Amsler   . Number of children: 2  . Years of education: Not on file  . Highest education level: Master's degree (e.g., MA, MS, MEng, MEd, MSW, MBA)  Occupational History  . Not on file  Social Needs  . Financial resource strain: Not hard at all  . Food insecurity    Worry: Never true    Inability: Never true  . Transportation needs    Medical: No    Non-medical: No  Tobacco  Use  . Smoking status: Never Smoker  . Smokeless tobacco: Never Used  . Tobacco comment: age 20 "tried it, didn't like it"  Substance and Sexual Activity  . Alcohol use: Yes    Comment: Occasionally  . Drug use: No  . Sexual activity: Yes    Birth control/protection: Implant, None  Lifestyle  . Physical activity    Days per week: 0 days    Minutes per session: 0 min  . Stress: Not at all  Relationships  . Social connections    Talks on phone: More than three times a week    Gets together: More than three times a week    Attends religious service: More than 4 times per year    Active member of club or organization: No    Attends meetings of clubs or organizations: Never    Relationship status: Living with partner  .  Intimate partner violence    Fear of current or ex partner: No    Emotionally abused: No    Physically abused: No    Forced sexual activity: No  Other Topics Concern  . Not on file  Social History Narrative   Lives with the father or her youngest child     Current Outpatient Medications:  .  cyclobenzaprine (FLEXERIL) 10 MG tablet, Take 1 tablet (10 mg total) by mouth at bedtime., Disp: 30 tablet, Rfl: 0 .  diclofenac (VOLTAREN) 75 MG EC tablet, Take 1 tablet (75 mg total) by mouth daily as needed., Disp: 30 tablet, Rfl: 0 .  etonogestrel (NEXPLANON) 68 MG IMPL implant, 1 each by Subdermal route once., Disp: , Rfl:  .  HYDROcodone-acetaminophen (NORCO/VICODIN) 5-325 MG tablet, , Disp: , Rfl:  .  traMADol (ULTRAM) 50 MG tablet, tramadol 50 mg tablet  Take 1 tablet every 6-8 hours by oral route., Disp: , Rfl:   Allergies  Allergen Reactions  . Percocet [Oxycodone-Acetaminophen] Itching    I personally reviewed active problem list, medication list, allergies, family history, social history with the patient/caregiver today.   ROS  Constitutional: Negative for fever , positive for weight change.  Respiratory: Negative for cough and shortness of breath.   Cardiovascular: Negative for chest pain or palpitations.  Gastrointestinal: Negative for abdominal pain, no bowel changes.  Musculoskeletal: Positive  for gait problem and joint swelling.  Skin: Negative for rash.  Neurological: Negative for dizziness or headache.  No other specific complaints in a complete review of systems (except as listed in HPI above).  Objective  Vitals:   02/27/19 1604  BP: 114/64  Pulse: 77  Resp: 16  Temp: (!) 97.1 F (36.2 C)  TempSrc: Temporal  SpO2: 99%  Weight: 222 lb 4.8 oz (100.8 kg)  Height: 5' 6.5" (1.689 m)    Body mass index is 35.34 kg/m.  Physical Exam  Constitutional: Patient appears well-developed and well-nourished. Obese  No distress.  HEENT: head atraumatic,  normocephalic, pupils equal and reactive to light,  neck supple Cardiovascular: Normal rate, regular rhythm and normal heart sounds.  No murmur heard. No BLE edema. Pulmonary/Chest: Effort normal and breath sounds normal. No respiratory distress. Abdominal: Soft.  There is no tenderness. Muscular skeletal: left lower leg on a splinter  Psychiatric: Patient has a normal mood and affect. behavior is normal. Judgment and thought content normal.   PHQ2/9: Depression screen [facility] 2/9 02/27/2019 11/28/2018 08/29/2018  Decreased Interest 0 0 0  Down, Depressed, Hopeless 0 0 0  PHQ - 2 Score 0 0 0  Altered sleeping 1 0 -  Tired, decreased energy 0 0 -  Change in appetite 0 0 -  Feeling bad or failure about yourself  0 0 -  Trouble concentrating 0 0 -  Moving slowly or fidgety/restless 0 0 -  Suicidal thoughts 0 0 -  PHQ-9 Score 1 0 -  Difficult doing work/chores Not difficult at all - -    phq 9 is negative   Fall Risk: Fall Risk  02/27/2019 11/28/2018 08/29/2018 08/07/2017  Falls in the past year? 1 0 0 No  Number falls in past yr: 0 0 0 -  Injury with Fall? 1 0 0 -  Comment Broke her left ankle and 5 metatarsal - - -     Functional Status Survey: Is the patient deaf or have difficulty hearing?: No Does the patient have difficulty seeing, even when wearing glasses/contacts?: No Does the patient have difficulty concentrating, remembering, or making decisions?: No Does the patient have difficulty walking or climbing stairs?: Yes Does the patient have difficulty dressing or bathing?: No Does the patient have difficulty doing errands alone such as visiting a doctor's office or shopping?: Yes   Assessment & Plan  1. BMI 35.0-35.9,adult  Discussed life style modification   2. Vitamin D deficiency  Discussed supplementation   3. ASCUS of cervix with negative high risk HPV   5. Hives  Not sure if from vibration or heat, triggered by exercise, it happened when she was young and  returned when she try to walk with her daughter around the block

## 2019-03-06 DIAGNOSIS — S92352A Displaced fracture of fifth metatarsal bone, left foot, initial encounter for closed fracture: Secondary | ICD-10-CM | POA: Diagnosis not present

## 2019-03-06 DIAGNOSIS — S46011A Strain of muscle(s) and tendon(s) of the rotator cuff of right shoulder, initial encounter: Secondary | ICD-10-CM | POA: Diagnosis not present

## 2019-03-06 DIAGNOSIS — S8265XA Nondisplaced fracture of lateral malleolus of left fibula, initial encounter for closed fracture: Secondary | ICD-10-CM | POA: Diagnosis not present

## 2019-03-12 ENCOUNTER — Encounter: Payer: Self-pay | Admitting: Family Medicine

## 2019-03-28 DIAGNOSIS — M2242 Chondromalacia patellae, left knee: Secondary | ICD-10-CM | POA: Diagnosis not present

## 2019-03-28 DIAGNOSIS — S8265XA Nondisplaced fracture of lateral malleolus of left fibula, initial encounter for closed fracture: Secondary | ICD-10-CM | POA: Diagnosis not present

## 2019-03-28 DIAGNOSIS — S92352A Displaced fracture of fifth metatarsal bone, left foot, initial encounter for closed fracture: Secondary | ICD-10-CM | POA: Diagnosis not present

## 2019-03-28 HISTORY — DX: Chondromalacia patellae, left knee: M22.42

## 2019-04-10 ENCOUNTER — Encounter: Payer: BLUE CROSS/BLUE SHIELD | Admitting: Family Medicine

## 2019-04-18 DIAGNOSIS — S8265XA Nondisplaced fracture of lateral malleolus of left fibula, initial encounter for closed fracture: Secondary | ICD-10-CM | POA: Diagnosis not present

## 2019-04-18 DIAGNOSIS — S92352A Displaced fracture of fifth metatarsal bone, left foot, initial encounter for closed fracture: Secondary | ICD-10-CM | POA: Diagnosis not present

## 2019-04-18 DIAGNOSIS — S46011A Strain of muscle(s) and tendon(s) of the rotator cuff of right shoulder, initial encounter: Secondary | ICD-10-CM | POA: Diagnosis not present

## 2019-04-18 DIAGNOSIS — M2242 Chondromalacia patellae, left knee: Secondary | ICD-10-CM | POA: Diagnosis not present

## 2019-05-06 ENCOUNTER — Other Ambulatory Visit: Payer: Self-pay

## 2019-05-06 DIAGNOSIS — Z20822 Contact with and (suspected) exposure to covid-19: Secondary | ICD-10-CM

## 2019-05-06 DIAGNOSIS — Z20828 Contact with and (suspected) exposure to other viral communicable diseases: Secondary | ICD-10-CM | POA: Diagnosis not present

## 2019-05-08 LAB — NOVEL CORONAVIRUS, NAA: SARS-CoV-2, NAA: NOT DETECTED

## 2019-05-13 ENCOUNTER — Other Ambulatory Visit: Payer: Self-pay

## 2019-05-13 DIAGNOSIS — Z20822 Contact with and (suspected) exposure to covid-19: Secondary | ICD-10-CM

## 2019-05-13 DIAGNOSIS — Z20828 Contact with and (suspected) exposure to other viral communicable diseases: Secondary | ICD-10-CM | POA: Diagnosis not present

## 2019-05-14 LAB — NOVEL CORONAVIRUS, NAA: SARS-CoV-2, NAA: NOT DETECTED

## 2019-05-16 DIAGNOSIS — S92352A Displaced fracture of fifth metatarsal bone, left foot, initial encounter for closed fracture: Secondary | ICD-10-CM | POA: Diagnosis not present

## 2019-05-16 DIAGNOSIS — S8265XA Nondisplaced fracture of lateral malleolus of left fibula, initial encounter for closed fracture: Secondary | ICD-10-CM | POA: Diagnosis not present

## 2019-05-22 ENCOUNTER — Encounter: Payer: Self-pay | Admitting: Family Medicine

## 2019-05-23 ENCOUNTER — Other Ambulatory Visit: Payer: Self-pay

## 2019-05-23 ENCOUNTER — Encounter: Payer: Self-pay | Admitting: Family Medicine

## 2019-05-23 ENCOUNTER — Ambulatory Visit (INDEPENDENT_AMBULATORY_CARE_PROVIDER_SITE_OTHER): Payer: BC Managed Care – PPO | Admitting: Family Medicine

## 2019-05-23 DIAGNOSIS — Z20822 Contact with and (suspected) exposure to covid-19: Secondary | ICD-10-CM

## 2019-05-23 DIAGNOSIS — Z7722 Contact with and (suspected) exposure to environmental tobacco smoke (acute) (chronic): Secondary | ICD-10-CM | POA: Diagnosis not present

## 2019-05-23 DIAGNOSIS — J069 Acute upper respiratory infection, unspecified: Secondary | ICD-10-CM | POA: Diagnosis not present

## 2019-05-23 MED ORDER — BENZONATATE 100 MG PO CAPS: 100.0000 mg | ORAL_CAPSULE | Freq: Three times a day (TID) | ORAL | 0 refills | Status: DC | PRN

## 2019-05-23 MED ORDER — GUAIFENESIN ER 600 MG PO TB12: 600.0000 mg | ORAL_TABLET | Freq: Two times a day (BID) | ORAL | 0 refills | Status: DC

## 2019-05-23 MED ORDER — LEVOCETIRIZINE DIHYDROCHLORIDE 5 MG PO TABS: 5.0000 mg | ORAL_TABLET | Freq: Every evening | ORAL | 1 refills | Status: DC

## 2019-05-23 NOTE — Progress Notes (Signed)
Name: Anna Beck   MRN: [id number]    DOB: 01-27-1986   Date:05/23/2019       Progress Note  Subjective  Chief Complaint  Chief Complaint  Patient presents with  . Sinusitis    headache,congested,facial pressure for 4 days    I connected with  Rica Mast on 05/23/19 at 12:40 PM EDT by telephone and verified that I am speaking with the correct person using two identifiers.   I discussed the limitations, risks, security and privacy concerns of performing an evaluation and management service by telephone and the availability of in person appointments. Staff also discussed with the patient that there may be a patient responsible charge related to this service. Patient Location: Home Provider Location: Office Additional Individuals present: None  HPI  Pt presents with concern for URI symptoms for about 4 days (05/20/2019). Symptoms include bilateral maxillary sinus pain and pressure, cough (productive - thick and green). 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  She notes that over the last 6 days she has had progressively worsening sinus pain and pressure.  She has had 1 sinus infection in the past, and this feels very similar.  She does work in a nursing facility with a COVID-19 outbreak - she is tested twice weekly at work and has been negative; she also went today to get tested through our services at [facility].  Denies fevers, body aches, chest pain, shortness of breath, no smell/taste changes.  She has tried a nasal spray, but does not like using them because it makes her throat sore.  Did take some robitussin and ibuprofen which is helping somewhat with her headache only.    Patient Active Problem List   Diagnosis Date Noted  . Vitamin D deficiency 02/27/2019  . BMI 35.0-35.9,adult 02/27/2019  . ASCUS of cervix with negative high risk HPV 02/27/2019  . Closed fracture of base of fifth metatarsal bone 02/25/2019   . Closed fracture of lateral malleolus 02/25/2019  . Menometrorrhagia 10/31/2018  . History of uterine fibroid 10/31/2018    Social History   Tobacco Use  . Smoking status: Never Smoker  . Smokeless tobacco: Never Used  . Tobacco comment: age 59 "tried it, didn't like it"  Substance Use Topics  . Alcohol use: Yes    Comment: Occasionally    Current Outpatient Medications:  .  etonogestrel (NEXPLANON) 68 MG IMPL implant, 1 each by Subdermal route once., Disp: , Rfl:  .  cyclobenzaprine (FLEXERIL) 10 MG tablet, Take 1 tablet (10 mg total) by mouth at bedtime. (Patient not taking: Reported on 05/23/2019), Disp: 30 tablet, Rfl: 0 .  HYDROcodone-acetaminophen (NORCO/VICODIN) 5-325 MG tablet, , Disp: , Rfl:  .  traMADol (ULTRAM) 50 MG tablet, tramadol 50 mg tablet  Take 1 tablet every 6-8 hours by oral route., Disp: , Rfl:   Allergies  Allergen Reactions  . Percocet [Oxycodone-Acetaminophen] Itching    I personally reviewed active problem list, medication list, allergies, notes from last encounter, lab results with the patient/caregiver today.  ROS  Ten systems reviewed and is negative except as mentioned in HPI  Objective  Virtual encounter, vitals not obtained.  There is no height or weight on file to calculate BMI.  Nursing Note and Vital Signs reviewed.  Physical Exam   Pulmonary/Chest: Effort normal. No respiratory distress. Speaking in complete sentences. Dry cough is present throughout visit. Neurological: Pt is alert and oriented  to person, place, and time. Coordination, speech and gait are normal.  Psychiatric: Patient has a normal mood and affect. behavior is normal. Judgment and thought content normal.  No results found for this or any previous visit (from the past 72 hour(s)).  Assessment & Plan  1. Viral URI with cough - Possibly an allergic reaction to cigarette smoke exposure as she has never been a smoker.  We are also awaiting COVID-19 results.  We  will hold on antibiotics for now - she will check back in 3-4 days if not improving and we will consider abx if needed for sinusitis at that time. - benzonatate (TESSALON PERLES) 100 MG capsule; Take 1 capsule (100 mg total) by mouth 3 (three) times daily as needed.  Dispense: 30 capsule; Refill: 0 - levocetirizine (XYZAL) 5 MG tablet; Take 1 tablet (5 mg total) by mouth every evening.  Dispense: 30 tablet; Refill: 1 - guaiFENesin (MUCINEX) 600 MG 12 hr tablet; Take 1 tablet (600 mg total) by mouth 2 (two) times daily.  Dispense: 20 tablet; Refill: 0  2. Exposure to secondhand smoke - Work note to remove her from smoke exposure if at all possible to help relieve her symptoms.   -Red flags and when to present for emergency care or RTC including fever >101.47F, chest pain, shortness of breath, new/worsening/un-resolving symptoms,  reviewed with patient at time of visit. Follow up and care instructions discussed and provided in AVS. - I discussed the assessment and treatment plan with the patient. The patient was provided an opportunity to ask questions and all were answered. The patient agreed with the plan and demonstrated an understanding of the instructions.  - The patient was advised to call back or seek an in-person evaluation if the symptoms worsen or if the condition fails to improve as anticipated.  I provided 16 minutes of non-face-to-face time during this encounter.  Hubbard Hartshorn, FNP

## 2019-05-25 LAB — NOVEL CORONAVIRUS, NAA: SARS-CoV-2, NAA: NOT DETECTED

## 2019-05-27 ENCOUNTER — Encounter: Payer: Self-pay | Admitting: Family Medicine

## 2019-06-03 ENCOUNTER — Other Ambulatory Visit: Payer: Self-pay | Admitting: *Deleted

## 2019-06-03 DIAGNOSIS — Z20822 Contact with and (suspected) exposure to covid-19: Secondary | ICD-10-CM

## 2019-06-05 LAB — NOVEL CORONAVIRUS, NAA: SARS-CoV-2, NAA: NOT DETECTED

## 2019-06-14 ENCOUNTER — Other Ambulatory Visit: Payer: Self-pay | Admitting: Family Medicine

## 2019-06-14 DIAGNOSIS — J069 Acute upper respiratory infection, unspecified: Secondary | ICD-10-CM

## 2019-06-28 ENCOUNTER — Other Ambulatory Visit: Payer: Self-pay | Admitting: Family Medicine

## 2019-06-28 DIAGNOSIS — J069 Acute upper respiratory infection, unspecified: Secondary | ICD-10-CM

## 2019-06-28 NOTE — Telephone Encounter (Signed)
Requested medication (s) are due for refill today: yes  Requested medication (s) are on the active medication list: yes  Last refill:  06/14/2019  Future visit scheduled: no  Notes to clinic:  Requesting a 90 day supply   Requested Prescriptions  Pending Prescriptions Disp Refills   levocetirizine (XYZAL) 5 MG tablet [Pharmacy Med Name: LEVOCETIRIZINE 5 MG TABLET] 90 tablet 1    Sig: TAKE 1 TABLET BY MOUTH EVERY DAY IN THE EVENING     Ear, Nose, and Throat:  Antihistamines Passed - 06/28/2019  3:25 PM      Passed - Valid encounter within last 12 months    Recent Outpatient Visits          1 month ago Viral URI with cough   [place], FNP   4 months ago BMI 35.0-35.9,adult   [facility] Steele Sizer, MD   7 months ago Sciatica of left side   [facility] [place], Drue Stager, MD   10 months ago Obesity (BMI 30.0-34.9)   [facility] Steele Sizer, MD

## 2019-07-01 DIAGNOSIS — M76812 Anterior tibial syndrome, left leg: Secondary | ICD-10-CM | POA: Diagnosis not present

## 2019-07-01 DIAGNOSIS — M76811 Anterior tibial syndrome, right leg: Secondary | ICD-10-CM | POA: Diagnosis not present

## 2019-07-01 NOTE — Telephone Encounter (Signed)
lft vm to schedule follow up

## 2019-07-05 DIAGNOSIS — M76811 Anterior tibial syndrome, right leg: Secondary | ICD-10-CM | POA: Diagnosis not present

## 2019-07-05 DIAGNOSIS — M76812 Anterior tibial syndrome, left leg: Secondary | ICD-10-CM | POA: Diagnosis not present

## 2019-07-08 DIAGNOSIS — M76811 Anterior tibial syndrome, right leg: Secondary | ICD-10-CM | POA: Diagnosis not present

## 2019-07-08 DIAGNOSIS — M76812 Anterior tibial syndrome, left leg: Secondary | ICD-10-CM | POA: Diagnosis not present

## 2019-07-11 DIAGNOSIS — M76812 Anterior tibial syndrome, left leg: Secondary | ICD-10-CM | POA: Diagnosis not present

## 2019-07-11 DIAGNOSIS — M76811 Anterior tibial syndrome, right leg: Secondary | ICD-10-CM | POA: Diagnosis not present

## 2019-07-16 DIAGNOSIS — M76811 Anterior tibial syndrome, right leg: Secondary | ICD-10-CM | POA: Diagnosis not present

## 2019-07-16 DIAGNOSIS — M76812 Anterior tibial syndrome, left leg: Secondary | ICD-10-CM | POA: Diagnosis not present

## 2019-07-19 DIAGNOSIS — M76811 Anterior tibial syndrome, right leg: Secondary | ICD-10-CM | POA: Diagnosis not present

## 2019-07-19 DIAGNOSIS — M76812 Anterior tibial syndrome, left leg: Secondary | ICD-10-CM | POA: Diagnosis not present

## 2019-07-22 DIAGNOSIS — M76811 Anterior tibial syndrome, right leg: Secondary | ICD-10-CM | POA: Diagnosis not present

## 2019-07-22 DIAGNOSIS — M76812 Anterior tibial syndrome, left leg: Secondary | ICD-10-CM | POA: Diagnosis not present

## 2019-07-23 DIAGNOSIS — M76812 Anterior tibial syndrome, left leg: Secondary | ICD-10-CM | POA: Diagnosis not present

## 2019-07-23 DIAGNOSIS — M76811 Anterior tibial syndrome, right leg: Secondary | ICD-10-CM | POA: Diagnosis not present

## 2019-07-31 DIAGNOSIS — M76811 Anterior tibial syndrome, right leg: Secondary | ICD-10-CM | POA: Diagnosis not present

## 2019-07-31 DIAGNOSIS — M76812 Anterior tibial syndrome, left leg: Secondary | ICD-10-CM | POA: Diagnosis not present

## 2019-08-08 ENCOUNTER — Encounter: Payer: Self-pay | Admitting: Family Medicine

## 2019-09-02 ENCOUNTER — Ambulatory Visit: Payer: Medicaid Other | Attending: Internal Medicine

## 2019-09-02 DIAGNOSIS — Z20822 Contact with and (suspected) exposure to covid-19: Secondary | ICD-10-CM | POA: Diagnosis not present

## 2019-09-03 LAB — NOVEL CORONAVIRUS, NAA: SARS-CoV-2, NAA: DETECTED — AB

## 2019-09-04 ENCOUNTER — Telehealth: Payer: Self-pay | Admitting: Nurse Practitioner

## 2019-09-04 NOTE — Telephone Encounter (Signed)
Called to Discuss with patient about Covid symptoms and the use of bamlanivimab, a monoclonal antibody infusion for those with mild to moderate Covid symptoms and at a high risk of hospitalization.     Pt is qualified for this infusion at the [facility] due to co-morbid conditions and/or a member of an at-risk group.     Unable to reach pt  

## 2019-09-12 ENCOUNTER — Encounter: Payer: Self-pay | Admitting: Family Medicine

## 2020-01-09 ENCOUNTER — Encounter: Payer: Self-pay | Admitting: Family Medicine

## 2020-03-03 ENCOUNTER — Encounter: Payer: Self-pay | Admitting: Obstetrics & Gynecology

## 2020-03-03 ENCOUNTER — Other Ambulatory Visit: Payer: Self-pay

## 2020-03-03 ENCOUNTER — Ambulatory Visit (INDEPENDENT_AMBULATORY_CARE_PROVIDER_SITE_OTHER): Payer: No Typology Code available for payment source | Admitting: Obstetrics & Gynecology

## 2020-03-03 ENCOUNTER — Other Ambulatory Visit (HOSPITAL_COMMUNITY)
Admission: RE | Admit: 2020-03-03 | Discharge: 2020-03-03 | Disposition: A | Discharge status: Home / Self Care | Payer: Medicaid Other | Source: Ambulatory Visit | Attending: Obstetrics & Gynecology | Admitting: Obstetrics & Gynecology

## 2020-03-03 VITALS — BP 100/70 | Ht 66.0 in | Wt 232.0 lb

## 2020-03-03 DIAGNOSIS — Z01419 Encounter for gynecological examination (general) (routine) without abnormal findings: Secondary | ICD-10-CM

## 2020-03-03 DIAGNOSIS — R87610 Atypical squamous cells of undetermined significance on cytologic smear of cervix (ASC-US): Secondary | ICD-10-CM | POA: Diagnosis not present

## 2020-03-03 NOTE — Patient Instructions (Signed)
PAP every  year Labs yearly (with PCP) Monitor bleeding    [place] if persists    Try hormone pills as overlap therapy first  Thank you for choosing [facility]. As part of our ongoing efforts to improve patient experience, we would appreciate your feedback. Please fill out the short survey that you will receive by mail or MyChart. Your opinion is important to [place]! - Dr. Kenton Kingfisher

## 2020-03-03 NOTE — Progress Notes (Signed)
HPI:      [name] is a 34 y.o. [id number] who LMP was Patient's last menstrual period was 02/08/2020., she presents today for her annual examination. The patient has no complaints today other than WORSE PERIODS w 9-20 days flow and pain.. The patient is sexually active. Her last pap: approximate date 2020 and was abnormal: ASCUS, HPV neg. The patient does perform self breast exams.  There is no notable family history of breast or ovarian cancer in her family.  The patient has regular exercise: yes.  The patient denies current symptoms of depression.    GYN History: Contraception: Nexplanon  PMHx: Past Medical History:  Diagnosis Date  . Anemia   . Fibroid uterus   . Scoliosis   . Sickle cell trait ([place])   . Trichomonas infection    early 20's   Past Surgical History:  Procedure Laterality Date  . CHOLECYSTECTOMY    . DG SCOLIOSIS STUDY ENTIRE SPINE ([place] HX)     States corrective surgery of scoliosis  . ROOT CANAL    . SMALL INTESTINE SURGERY  1986   Pyloric stenosis  . [employer]  . TONSILLECTOMY Bilateral 05/18/2018   Procedure: TONSILLECTOMY;  Surgeon: Beverly Gust, MD;  Location: [place];  Service: ENT;  Laterality: Bilateral;   Family History  Problem Relation Age of Onset  . Hypertension Mother   . Depression Mother   . Alcohol abuse Father   . Asthma Sister   . Heart disease Maternal Grandmother   . Alzheimer's disease Paternal Grandmother   . Depression Sister    Social History   Tobacco Use  . Smoking status: Never Smoker  . Smokeless tobacco: Never Used  . Tobacco comment: age 88 "tried it, didn't like it"  Vaping Use  . Vaping Use: Never used  Substance Use Topics  . Alcohol use: Yes    Comment: Occasionally  . Drug use: No   No current outpatient medications on file. Allergies: Percocet [oxycodone-acetaminophen]  Review of Systems  Constitutional: Negative for chills, fever and malaise/fatigue.  HENT: Negative  for congestion, sinus pain and sore throat.   Eyes: Negative for blurred vision and pain.  Respiratory: Negative for cough and wheezing.   Cardiovascular: Negative for chest pain and leg swelling.  Gastrointestinal: Negative for abdominal pain, constipation, diarrhea, heartburn, nausea and vomiting.  Genitourinary: Negative for dysuria, frequency, hematuria and urgency.  Musculoskeletal: Negative for back pain, joint pain, myalgias and neck pain.  Skin: Negative for itching and rash.  Neurological: Negative for dizziness, tremors and weakness.  Endo/Heme/Allergies: Does not bruise/bleed easily.  Psychiatric/Behavioral: Negative for depression. The patient is not nervous/anxious and does not have insomnia.     Objective: BP 100/70   Ht 5\' 6"  (1.676 m)   Wt 232 lb (105.2 kg)   LMP 02/08/2020   BMI 37.45 kg/m   Filed Weights   03/03/20 1412  Weight: 232 lb (105.2 kg)   Body mass index is 37.45 kg/m. Physical Exam Constitutional:      General: She is not in acute distress.    Appearance: She is well-developed.  Genitourinary:     Pelvic exam was performed with patient supine.     Vagina, uterus and rectum normal.     No lesions in the vagina.     No vaginal bleeding.     No cervical motion tenderness, friability, lesion or polyp.     Uterus is mobile.     Uterus  is not enlarged.     No uterine mass detected.    Uterus is midaxial.     No right or left adnexal mass present.     Right adnexa not tender.     Left adnexa not tender.  HENT:     Head: Normocephalic and atraumatic. No laceration.     Right Ear: Hearing normal.     Left Ear: Hearing normal.     Mouth/Throat:     Pharynx: Uvula midline.  Eyes:     Pupils: Pupils are equal, round, and reactive to light.  Neck:     Thyroid: No thyromegaly.  Cardiovascular:     Rate and Rhythm: Normal rate and regular rhythm.     Heart sounds: No murmur heard.  No friction rub. No gallop.   Pulmonary:     Effort: Pulmonary  effort is normal. No respiratory distress.     Breath sounds: Normal breath sounds. No wheezing.  Chest:     Breasts:        Right: No mass, skin change or tenderness.        Left: No mass, skin change or tenderness.  Abdominal:     General: Bowel sounds are normal. There is no distension.     Palpations: Abdomen is soft.     Tenderness: There is no abdominal tenderness. There is no rebound.  Musculoskeletal:        General: Normal range of motion.     Cervical back: Normal range of motion and neck supple.     Comments: Nexplanon in place left arm  Neurological:     Mental Status: She is alert and oriented to person, place, and time.     Cranial Nerves: No cranial nerve deficit.  Skin:    General: Skin is warm and dry.  Psychiatric:        Judgment: Judgment normal.  Vitals reviewed.     Assessment:  ANNUAL EXAM 1. Women's annual routine gynecological examination   2. ASCUS of cervix with negative high risk HPV      Screening Plan:            1.  Cervical Screening-  Pap smear done today ASCUS last year  2. Breast screening- Exam annually and mammogram>40 planned   3. Labs managed by PCP  5. Counseling for contraception: Nexplanon  Other:  1. Women's annual routine gynecological examination  2. ASCUS of cervix with negative high risk HPV - Cytology - PAP  3. Plan OCP therapy x2 mos w Nexplanon, then see how periods do after that    F/U  Return in about 1 year (around 03/03/2021) for Annual.  Barnett Applebaum, MD, [facility], [facility] 03/03/2020  2:39 PM

## 2020-03-06 LAB — CYTOLOGY - PAP
Comment: NEGATIVE
Diagnosis: NEGATIVE
High risk HPV: NEGATIVE

## 2020-07-21 ENCOUNTER — Other Ambulatory Visit: Payer: Self-pay

## 2020-07-21 ENCOUNTER — Ambulatory Visit (INDEPENDENT_AMBULATORY_CARE_PROVIDER_SITE_OTHER): Payer: Medicaid Other | Admitting: Internal Medicine

## 2020-07-21 DIAGNOSIS — J069 Acute upper respiratory infection, unspecified: Secondary | ICD-10-CM

## 2020-07-21 DIAGNOSIS — H1013 Acute atopic conjunctivitis, bilateral: Secondary | ICD-10-CM

## 2020-07-21 DIAGNOSIS — H1032 Unspecified acute conjunctivitis, left eye: Secondary | ICD-10-CM | POA: Diagnosis not present

## 2020-07-21 MED ORDER — TOBRAMYCIN 0.3 % OP SOLN: 1.0000 [drp] | OPHTHALMIC | 0 refills | Status: DC

## 2020-07-21 NOTE — Progress Notes (Signed)
Name: Anna Beck   MRN: [id number]    DOB: [birth year]   Date:07/21/2020       Progress Note  Subjective  Chief Complaint  Chief Complaint  Patient presents with  . itchy eyes  . watery eyes    I connected with  Anna Beck on 07/21/20 at 11:20 AM EST by telephone and verified that I am speaking with the correct person using two identifiers.  I discussed the limitations, risks, security and privacy concerns of performing an evaluation and management service by telephone and the availability of in person appointments. The patient expressed understanding and agreed to proceed. Staff also discussed with the patient that there may be a patient responsible charge related to this service. Patient Location: work Engineer, structural: [facility] Additional Individuals present: none  HPI Patient is a 34 year old female patient of Dr. Ancil Boozer Presents today with a phone visit for the above complaint   She notes has itchy and watery eyes for past two weeks, occasionally get red,  Taking claritin and some benadryl without much success and tried Advil multi symptom last night. This am left eye was crusty, almost crusted shut when woke up. No discharge presently. Not markedly red presently.  The left eye had some discomfort this morning, but that has improved.  No photophobia concerns. No vision changes except occasionally gets blurry with watering over the past couple weeks, no loss of vision or double vision concerns Not wear contacts. Does feel like is better today. Has a cold and a little congested and sneezing, no marked PND, no fevers, and no sinus pain.  No cough.    Patient Active Problem List   Diagnosis Date Noted  . Vitamin D deficiency 02/27/2019  . BMI 35.0-35.9,adult 02/27/2019  . ASCUS of cervix with negative high risk HPV 02/27/2019  . Closed fracture of base of fifth metatarsal bone 02/25/2019  . Closed fracture of lateral malleolus 02/25/2019  . Menometrorrhagia  10/31/2018  . History of uterine fibroid 10/31/2018    Past Surgical History:  Procedure Laterality Date  . CHOLECYSTECTOMY    . DG SCOLIOSIS STUDY ENTIRE SPINE ([place] HX)     States corrective surgery of scoliosis  . ROOT CANAL    . SMALL INTESTINE SURGERY  1986   Pyloric stenosis  . [place]  . TONSILLECTOMY Bilateral 05/18/2018   Procedure: TONSILLECTOMY;  Surgeon: Beverly Gust, MD;  Location: [place];  Service: ENT;  Laterality: Bilateral;    Family History  Problem Relation Age of Onset  . Hypertension Mother   . Depression Mother   . Alcohol abuse Father   . Asthma Sister   . Heart disease Maternal Grandmother   . Alzheimer's disease Paternal Grandmother   . Depression Sister     Social History   Tobacco Use  . Smoking status: Never Smoker  . Smokeless tobacco: Never Used  . Tobacco comment: age 1 "tried it, didn't like it"  Substance Use Topics  . Alcohol use: Yes    Comment: Occasionally    No current outpatient medications on file.  Allergies  Allergen Reactions  . Percocet [Oxycodone-Acetaminophen] Itching    With staff assistance, above reviewed with the patient today.  ROS: As per HPI, otherwise no specific complaints on a limited and focused system review   Objective  Virtual encounter, vitals not obtained.  There is no height or weight on file to calculate BMI.  Physical Exam   Appears in NAD via conversation,  very pleasant Breathing: No obvious respiratory distress. Speaking in complete sentences Neurological: Pt is alert, Speech is normal Psychiatric: Patient has a normal mood and affect,  Judgment and thought content normal.   No results found for this or any previous visit (from the past 72 hour(s)).  PHQ2/9: Depression screen [facility] 2/9 07/21/2020 05/23/2019 02/27/2019 11/28/2018 08/29/2018  Decreased Interest 0 0 0 0 0  Down, Depressed, Hopeless 0 0 0 0 0  PHQ - 2 Score 0 0 0 0 0  Altered sleeping - 0 1 0 -   Tired, decreased energy - 0 0 0 -  Change in appetite - 0 0 0 -  Feeling bad or failure about yourself  - 0 0 0 -  Trouble concentrating - 0 0 0 -  Moving slowly or fidgety/restless - 0 0 0 -  Suicidal thoughts - 0 0 0 -  PHQ-9 Score - 0 1 0 -  Difficult doing work/chores - Not difficult at all Not difficult at all - -   PHQ-2/9 Result reviewed  Fall Risk: Fall Risk  07/21/2020 05/23/2019 02/27/2019 11/28/2018 08/29/2018  Falls in the past year? 0 1 1 0 0  Number falls in past yr: 0 1 0 0 0  Injury with Fall? 0 1 1 0 0  Comment - - Broke her left ankle and 5 metatarsal - -  Follow up - Falls evaluation completed - - -     Assessment & Plan 1. Acute conjunctivitis of left eye, unspecified acute conjunctivitis type/allergic conjunctivitis bilateral Noted the limitations of this being a phone visit today.   Patient had more likely an allergic type of conjunctivitis starting a couple weeks ago, with the itchiness and watering of the eyes.  She was trying antihistamines to help. This morning had some crustiness in the left eye when she awoke, and some discomfort with some concern for potentially an infectious component.  She has no marked pain in that left eye presently.  No concerning vision changes.  She also noted some mild cold symptoms presently. Educated that the URIs are typically viral, and a lot of the conjunctivitis is that her infections are viral as well.  Do have some concerns when starts to develop a crusty discharge as she noted this morning. Discussed options, and felt best to add a tobramycin ophthalmic solution-apply to the left eye 4 times daily to help manage any potential infectious concern.  She is not a contact lens wearer. Can continue symptomatic measures for her mild cold symptoms. Noted if her symptoms are worsening, especially if she develops pain in the left eye, photophobia concerns, or discharge from the eye, she needs to be evaluated and to follow-up.  I also  noted if the crustiness symptoms improved and she is feeling better, although still having some itchy eyes, watery eyes component, can try a Zaditor eyedrop which is over-the-counter, and assess her response. He should follow-up if symptoms is not improving or more problematic.  2. Upper respiratory tract infection, unspecified type As above, some mild upper respiratory infectious symptoms noted recently and recommended continuing symptomatic measures to manage.    I discussed the assessment and treatment plan with the patient. The patient was provided an opportunity to ask questions and all were answered. The patient agreed with the plan and demonstrated an understanding of the instructions.   The patient was advised to call back or seek an in-person evaluation if the symptoms worsen or if the condition fails to improve as  anticipated.  I provided 15 minutes of non-face-to-face time during this encounter that included discussing at length patient's sx/history, pertinent pmhx, medications, treatment and follow up plan. This time also included the necessary documentation, orders, and chart review.  Towanda Malkin, MD

## 2020-11-11 DIAGNOSIS — Z03818 Encounter for observation for suspected exposure to other biological agents ruled out: Secondary | ICD-10-CM | POA: Diagnosis not present

## 2020-11-24 DIAGNOSIS — Z20822 Contact with and (suspected) exposure to covid-19: Secondary | ICD-10-CM | POA: Diagnosis not present

## 2020-12-03 ENCOUNTER — Encounter: Payer: Self-pay | Admitting: Family Medicine

## 2020-12-07 ENCOUNTER — Ambulatory Visit: Payer: Medicaid Other | Admitting: Family Medicine

## 2020-12-10 ENCOUNTER — Encounter: Payer: Self-pay | Admitting: Family Medicine

## 2020-12-10 NOTE — Progress Notes (Signed)
Name: Anna Beck   MRN: [id number]    DOB: [birth year]   Date:12/11/2020       Progress Note  Subjective  Chief Complaint  Skin Rash  HPI  Rash: she was at [place], she noticed a bumpy rash that was very pruriginous. She states since May second she has been gradually getting better, only has a spot on left arm that is bumpy and a spot on her face, she is also pilling on her upper back. She states it was on both arms. It is still itchy and worse at night. She use some hydrocortisone cream   Dysthymia: she states she was diagnosed with post-partum depression after the birth of her first child 36 years ago, had a daughter almost 3 years ago and is still with her father, however states not libido, surprised that he is still with her. They go on dates but she does not feel like having sex. She is also stressed at work, does not like getting in the mornings at times, not cleaning her house. She never took anti-depressants and never had therapy   Vitamin D def: advised to resume supplementation    Patient Active Problem List   Diagnosis Date Noted  . Chondromalacia of left patella 03/28/2019  . Vitamin D deficiency 02/27/2019  . BMI 35.0-35.9,adult 02/27/2019  . ASCUS of cervix with negative high risk HPV 02/27/2019  . Closed fracture of base of fifth metatarsal bone 02/25/2019  . Closed fracture of lateral malleolus 02/25/2019  . Menometrorrhagia 10/31/2018  . History of uterine fibroid 10/31/2018    Past Surgical History:  Procedure Laterality Date  . CHOLECYSTECTOMY    . DG SCOLIOSIS STUDY ENTIRE SPINE ([place] [facility])     States corrective surgery of scoliosis  . ROOT CANAL    . SMALL INTESTINE SURGERY  1986   Pyloric stenosis  . [place]  . TONSILLECTOMY Bilateral 05/18/2018   Procedure: TONSILLECTOMY;  Surgeon: Beverly Gust, MD;  Location: [place];  Service: ENT;  Laterality: Bilateral;    Family History  Problem Relation Age of Onset  .  Hypertension Mother   . Depression Mother   . Alcohol abuse Father   . Asthma Sister   . Heart disease Maternal Grandmother   . Alzheimer's disease Paternal Grandmother   . Depression Sister     Social History   Tobacco Use  . Smoking status: Never Smoker  . Smokeless tobacco: Never Used  . Tobacco comment: age 42 "tried it, didn't like it"  Substance Use Topics  . Alcohol use: Yes    Comment: Occasionally    No current outpatient medications on file.  Allergies  Allergen Reactions  . Percocet [Oxycodone-Acetaminophen] Itching    I personally reviewed active problem list, medication list, allergies, family history, social history, health maintenance with the patient/caregiver today.   ROS  Ten systems reviewed and is negative except as mentioned in HPI   Objective  Vitals:   12/11/20 1102  BP: 122/78  Pulse: 90  Resp: 18  Temp: 98.1 F (36.7 C)  TempSrc: Oral  SpO2: 99%  Weight: 231 lb 12.8 oz (105.1 kg)  Height: 5\' 7"  (1.702 m)    Body mass index is 36.31 kg/m.  Physical Exam  Constitutional: Patient appears well-developed and well-nourished. Obese No distress.  HEENT: head atraumatic, normocephalic, pupils equal and reactive to light,  neck supple Cardiovascular: Normal rate, regular rhythm and normal heart sounds.  No murmur heard. No BLE  edema. Pulmonary/Chest: Effort normal and breath sounds normal. No respiratory distress. Abdominal: Soft.  There is no tenderness. Psychiatric: Patient has a normal mood and affect. behavior is normal. Judgment and thought content normal. Skin: bumpy rash on left mid arm, pilling on upper back, small area on right cheek    PHQ2/9: Depression screen [facility] 2/9 12/11/2020 07/21/2020 05/23/2019 02/27/2019 11/28/2018  Decreased Interest 1 0 0 0 0  Down, Depressed, Hopeless 1 0 0 0 0  PHQ - 2 Score 2 0 0 0 0  Altered sleeping 1 - 0 1 0  Tired, decreased energy 1 - 0 0 0  Change in appetite 0 - 0 0 0  Feeling bad or  failure about yourself  1 - 0 0 0  Trouble concentrating 0 - 0 0 0  Moving slowly or fidgety/restless 0 - 0 0 0  Suicidal thoughts 0 - 0 0 0  PHQ-9 Score 5 - 0 1 0  Difficult doing work/chores Somewhat difficult - Not difficult at all Not difficult at all -    phq 9 is positive  GAD 7 : Generalized Anxiety Score 12/11/2020  Nervous, Anxious, on Edge 1  Control/stop worrying 1  Worry too much - different things 1  Trouble relaxing 1  Restless 0  Easily annoyed or irritable 1  Afraid - awful might happen 0  Total GAD 7 Score 5  Anxiety Difficulty Somewhat difficult     Fall Risk: Fall Risk  12/11/2020 07/21/2020 05/23/2019 02/27/2019 11/28/2018  Falls in the past year? 0 0 1 1 0  Number falls in past yr: - 0 1 0 0  Injury with Fall? - 0 1 1 0  Comment - - - Broke her left ankle and 5 metatarsal -  Follow up - - Falls evaluation completed - -      Functional Status Survey: Is the patient deaf or have difficulty hearing?: No Does the patient have difficulty seeing, even when wearing glasses/contacts?: No Does the patient have difficulty concentrating, remembering, or making decisions?: Yes Does the patient have difficulty walking or climbing stairs?: No Does the patient have difficulty dressing or bathing?: No Does the patient have difficulty doing errands alone such as visiting a doctor's office or shopping?: No    Assessment & Plan  1. Dysthymia  - Ambulatory referral to Psychology She is changing jobs June 6th and hopefully symptoms will improve    2. Vitamin D deficiency  Recheck labs  3. Pruritic rash  - hydrOXYzine (ATARAX/VISTARIL) 10 MG tablet; Take 1 tablet (10 mg total) by mouth at bedtime as needed.  Dispense: 30 tablet; Refill: 0  4. Other fatigue  - CBC with Differential/Platelet - COMPLETE METABOLIC PANEL WITH GFR - VITAMIN D 25 Hydroxy (Vit-D Deficiency, Fractures) - Vitamin B12 - TSH  5. Need for hepatitis C screening test  - Hepatitis C  antibody  6. Lipid screening  - Lipid panel  7. Diabetes mellitus screening  - Hemoglobin A1c

## 2020-12-11 ENCOUNTER — Encounter: Payer: Self-pay | Admitting: Family Medicine

## 2020-12-11 ENCOUNTER — Other Ambulatory Visit: Payer: Self-pay

## 2020-12-11 ENCOUNTER — Ambulatory Visit: Payer: Medicaid Other | Admitting: Family Medicine

## 2020-12-11 VITALS — BP 122/78 | HR 90 | Temp 98.1°F | Resp 18 | Ht 67.0 in | Wt 231.8 lb

## 2020-12-11 DIAGNOSIS — Z1159 Encounter for screening for other viral diseases: Secondary | ICD-10-CM | POA: Diagnosis not present

## 2020-12-11 DIAGNOSIS — Z131 Encounter for screening for diabetes mellitus: Secondary | ICD-10-CM

## 2020-12-11 DIAGNOSIS — L282 Other prurigo: Secondary | ICD-10-CM | POA: Diagnosis not present

## 2020-12-11 DIAGNOSIS — E559 Vitamin D deficiency, unspecified: Secondary | ICD-10-CM

## 2020-12-11 DIAGNOSIS — F341 Dysthymic disorder: Secondary | ICD-10-CM | POA: Diagnosis not present

## 2020-12-11 DIAGNOSIS — Z13220 Encounter for screening for lipoid disorders: Secondary | ICD-10-CM

## 2020-12-11 DIAGNOSIS — R5383 Other fatigue: Secondary | ICD-10-CM | POA: Diagnosis not present

## 2020-12-11 MED ORDER — HYDROXYZINE HCL 10 MG PO TABS: 10.0000 mg | ORAL_TABLET | Freq: Every evening | ORAL | 0 refills | Status: DC | PRN

## 2020-12-14 LAB — CBC WITH DIFFERENTIAL/PLATELET
Absolute Monocytes: 403 cells/uL (ref 200–950)
Basophils Absolute: 37 cells/uL (ref 0–200)
Basophils Relative: 0.6 %
Eosinophils Absolute: 31 cells/uL (ref 15–500)
Eosinophils Relative: 0.5 %
HCT: 40.8 % (ref 35.0–45.0)
Hemoglobin: 13.3 g/dL (ref 11.7–15.5)
Lymphs Abs: 1587 cells/uL (ref 850–3900)
MCH: 29 pg (ref 27.0–33.0)
MCHC: 32.6 g/dL (ref 32.0–36.0)
MCV: 88.9 fL (ref 80.0–100.0)
MPV: 10 fL (ref 7.5–12.5)
Monocytes Relative: 6.5 %
Neutro Abs: 4142 cells/uL (ref 1500–7800)
Neutrophils Relative %: 66.8 %
Platelets: 308 10*3/uL (ref 140–400)
RBC: 4.59 10*6/uL (ref 3.80–5.10)
RDW: 12.8 % (ref 11.0–15.0)
Total Lymphocyte: 25.6 %
WBC: 6.2 10*3/uL (ref 3.8–10.8)

## 2020-12-14 LAB — COMPLETE METABOLIC PANEL WITH GFR
AG Ratio: 1.7 (calc) (ref 1.0–2.5)
ALT: 10 U/L (ref 6–29)
AST: 14 U/L (ref 10–30)
Albumin: 4.5 g/dL (ref 3.6–5.1)
Alkaline phosphatase (APISO): 67 U/L (ref 31–125)
BUN/Creatinine Ratio: 9 (calc) (ref 6–22)
BUN: 6 mg/dL — ABNORMAL LOW (ref 7–25)
CO2: 28 mmol/L (ref 20–32)
Calcium: 9.4 mg/dL (ref 8.6–10.2)
Chloride: 105 mmol/L (ref 98–110)
Creat: 0.68 mg/dL (ref 0.50–1.10)
GFR, Est African American: 130 mL/min/{1.73_m2} (ref >=60)
GFR, Est Non African American: 113 mL/min/{1.73_m2} (ref >=60)
Globulin: 2.6 g/dL (calc) (ref 1.9–3.7)
Glucose, Bld: 78 mg/dL (ref 65–99)
Potassium: 3.9 mmol/L (ref 3.5–5.3)
Sodium: 140 mmol/L (ref 135–146)
Total Bilirubin: 0.6 mg/dL (ref 0.2–1.2)
Total Protein: 7.1 g/dL (ref 6.1–8.1)

## 2020-12-14 LAB — VITAMIN D 25 HYDROXY (VIT D DEFICIENCY, FRACTURES): Vit D, 25-Hydroxy: 24 ng/mL — ABNORMAL LOW (ref 30–100)

## 2020-12-14 LAB — LIPID PANEL
Cholesterol: 149 mg/dL (ref <200)
HDL: 47 mg/dL — ABNORMAL LOW (ref >=50)
LDL Cholesterol (Calc): 84 mg/dL (calc)
Non-HDL Cholesterol (Calc): 102 mg/dL (calc) (ref <130)
Total CHOL/HDL Ratio: 3.2 (calc) (ref <5.0)
Triglycerides: 88 mg/dL (ref <150)

## 2020-12-14 LAB — HEMOGLOBIN A1C
Hgb A1c MFr Bld: 5.1 % of total Hgb (ref <5.7)
Mean Plasma Glucose: 100 mg/dL
eAG (mmol/L): 5.5 mmol/L

## 2020-12-14 LAB — HEPATITIS C ANTIBODY
Hepatitis C Ab: NONREACTIVE
SIGNAL TO CUT-OFF: 0.01 (ref <1.00)

## 2020-12-14 LAB — TSH: TSH: 0.9 mIU/L

## 2020-12-14 LAB — VITAMIN B12: Vitamin B-12: 338 pg/mL (ref 200–1100)

## 2020-12-30 ENCOUNTER — Other Ambulatory Visit: Payer: Self-pay

## 2020-12-30 ENCOUNTER — Ambulatory Visit (HOSPITAL_COMMUNITY): Payer: Medicaid Other | Admitting: Clinical

## 2020-12-30 DIAGNOSIS — F32A Depression, unspecified: Secondary | ICD-10-CM
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
## 2021-01-04 ENCOUNTER — Other Ambulatory Visit: Payer: Self-pay | Admitting: Family Medicine

## 2021-01-04 DIAGNOSIS — L282 Other prurigo: Secondary | ICD-10-CM

## 2021-01-04 NOTE — Telephone Encounter (Signed)
Requested Prescriptions  Pending Prescriptions Disp Refills  . hydrOXYzine (ATARAX/VISTARIL) 10 MG tablet [Pharmacy Med Name: HYDROXYZINE HCL 10 MG TABLET] 90 tablet 1    Sig: TAKE 1 TABLET BY MOUTH AT BEDTIME AS NEEDED.     Ear, Nose, and Throat:  Antihistamines Passed - 01/04/2021  1:30 PM      Passed - Valid encounter within last 12 months    Recent Outpatient Visits          3 weeks ago [facility] [place], Drue Stager, MD   5 months ago Acute conjunctivitis of left eye, unspecified acute conjunctivitis type   [facility] [place] Malkin, MD   1 year ago Viral URI with cough   [place], [place]   1 year ago BMI 35.0-35.9,adult   [facility] Steele Sizer, MD   2 years ago Sciatica of left side   [facility] Steele Sizer, MD

## 2021-04-26 ENCOUNTER — Telehealth: Payer: Self-pay

## 2021-04-26 NOTE — Telephone Encounter (Signed)
Patient is scheduled for Annual / Nexplanon removal and replacement 05/18/21 with [facility]

## 2021-04-30 NOTE — Telephone Encounter (Signed)
Noted. Will order to arrive by apt date/time. 

## 2021-05-18 ENCOUNTER — Encounter: Payer: Self-pay | Admitting: Obstetrics & Gynecology

## 2021-05-18 ENCOUNTER — Other Ambulatory Visit: Payer: Self-pay

## 2021-05-18 ENCOUNTER — Ambulatory Visit (INDEPENDENT_AMBULATORY_CARE_PROVIDER_SITE_OTHER): Payer: Medicaid Other | Admitting: Obstetrics & Gynecology

## 2021-05-18 ENCOUNTER — Other Ambulatory Visit (HOSPITAL_COMMUNITY)
Admission: RE | Admit: 2021-05-18 | Discharge: 2021-05-18 | Disposition: A | Discharge status: Home / Self Care | Payer: Medicaid Other | Source: Ambulatory Visit | Attending: Obstetrics & Gynecology | Admitting: Obstetrics & Gynecology

## 2021-05-18 VITALS — BP 120/80 | Ht 66.0 in | Wt 234.0 lb

## 2021-05-18 DIAGNOSIS — Z23 Encounter for immunization: Secondary | ICD-10-CM

## 2021-05-18 DIAGNOSIS — R87610 Atypical squamous cells of undetermined significance on cytologic smear of cervix (ASC-US): Secondary | ICD-10-CM

## 2021-05-18 DIAGNOSIS — Z3046 Encounter for surveillance of implantable subdermal contraceptive: Secondary | ICD-10-CM | POA: Diagnosis not present

## 2021-05-18 DIAGNOSIS — Z01419 Encounter for gynecological examination (general) (routine) without abnormal findings: Secondary | ICD-10-CM

## 2021-05-18 NOTE — Patient Instructions (Signed)
Nexplanon Instructions After Insertion  Keep bandage clean and dry for 24 hours  May use ice/Tylenol/Ibuprofen for soreness or pain  If you develop fever, drainage or increased warmth from incision site-contact office immediately   Thank you for choosing [facility]. As part of our ongoing efforts to improve patient experience, we would appreciate your feedback. Please fill out the short survey that you will receive by mail or MyChart. Your opinion is important to [place]! -Dr Kenton Kingfisher

## 2021-05-18 NOTE — Progress Notes (Signed)
HPI:      [name] is a 35 y.o. [id number] who LMP was Patient's last menstrual period was 04/29/2021., she presents today for her annual examination. The patient has no complaints today. The patient is sexually active. Her last pap: approximate date 2021 and was normal and prior ASCUS . The patient does perform self breast exams.  There is notable family history of breast or ovarian cancer in her family.  The patient has regular exercise: yes.  The patient denies current symptoms of depression.    GYN History: Contraception: Nexplanon year 3    She has irreg cycles and often last 9 days.  She prefers tNexplanon to pills, Depo, and IUD.  PMHx: Past Medical History:  Diagnosis Date   Anemia    Chondromalacia of left patella 03/28/2019   Fibroid uterus    Scoliosis    Sickle cell trait ([place])    Trichomonas infection    early 20's   Past Surgical History:  Procedure Laterality Date   CHOLECYSTECTOMY     DG SCOLIOSIS STUDY ENTIRE SPINE ([facility])     States corrective surgery of scoliosis   ROOT CANAL     SMALL INTESTINE SURGERY  1986   Pyloric stenosis   SPINE SURGERY  1997   TONSILLECTOMY Bilateral 05/18/2018   Procedure: TONSILLECTOMY;  Surgeon: Beverly Gust, MD;  Location: [facility];  Service: ENT;  Laterality: Bilateral;   Family History  Problem Relation Age of Onset   Hypertension Mother    Depression Mother    Alcohol abuse Father    Asthma Sister    Heart disease Maternal Grandmother    Alzheimer's disease Paternal Grandmother    Depression Sister    Social History   Tobacco Use   Smoking status: Never   Smokeless tobacco: Never   Tobacco comments:    age 48 "tried it, didn't like it"  Vaping Use   Vaping Use: Never used  Substance Use Topics   Alcohol use: Yes    Comment: Occasionally   Drug use: No    Current Outpatient Medications:    etonogestrel (NEXPLANON) 68 MG IMPL implant, 1 each by Subdermal route once., Disp: , Rfl:     hydrOXYzine (ATARAX/VISTARIL) 10 MG tablet, TAKE 1 TABLET BY MOUTH AT BEDTIME AS NEEDED. (Patient not taking: Reported on 05/18/2021), Disp: 90 tablet, Rfl: 1 Allergies: Percocet [oxycodone-acetaminophen]  Review of Systems  Constitutional:  Negative for chills, fever and malaise/fatigue.  HENT:  Negative for congestion, sinus pain and sore throat.   Eyes:  Negative for blurred vision and pain.  Respiratory:  Negative for cough and wheezing.   Cardiovascular:  Negative for chest pain and leg swelling.  Gastrointestinal:  Negative for abdominal pain, constipation, diarrhea, heartburn, nausea and vomiting.  Genitourinary:  Negative for dysuria, frequency, hematuria and urgency.  Musculoskeletal:  Negative for back pain, joint pain, myalgias and neck pain.  Skin:  Negative for itching and rash.  Neurological:  Negative for dizziness, tremors and weakness.  Endo/Heme/Allergies:  Does not bruise/bleed easily.  Psychiatric/Behavioral:  Negative for depression. The patient is not nervous/anxious and does not have insomnia.    Objective: BP 120/80   Ht 5\' 6"  (1.676 m)   Wt 234 lb (106.1 kg)   LMP 04/29/2021   BMI 37.77 kg/m   Filed Weights   05/18/21 1422  Weight: 234 lb (106.1 kg)   Body mass index is 37.77 kg/m. Physical Exam Constitutional:  General: She is not in acute distress.    Appearance: She is well-developed.  Genitourinary:     Bladder, rectum and urethral meatus normal.     No lesions in the vagina.     Right Labia: No rash, tenderness or lesions.    Left Labia: No tenderness, lesions or rash.    No vaginal bleeding.      Right Adnexa: not tender and no mass present.    Left Adnexa: not tender and no mass present.    No cervical motion tenderness, friability, lesion or polyp.     Uterus is not enlarged.     No uterine mass detected.    Uterus is midaxial.     Pelvic exam was performed with patient in the lithotomy position.  Breasts:    Right: No mass, skin  change or tenderness.     Left: No mass, skin change or tenderness.  HENT:     Head: Normocephalic and atraumatic. No laceration.     Right Ear: Hearing normal.     Left Ear: Hearing normal.     Mouth/Throat:     Pharynx: Uvula midline.  Eyes:     Pupils: Pupils are equal, round, and reactive to light.  Neck:     Thyroid: No thyromegaly.  Cardiovascular:     Rate and Rhythm: Normal rate and regular rhythm.     Heart sounds: No murmur heard.   No friction rub. No gallop.  Pulmonary:     Effort: Pulmonary effort is normal. No respiratory distress.     Breath sounds: Normal breath sounds. No wheezing.  Abdominal:     General: Bowel sounds are normal. There is no distension.     Palpations: Abdomen is soft.     Tenderness: There is no abdominal tenderness. There is no rebound.  Musculoskeletal:        General: Normal range of motion.     Cervical back: Normal range of motion and neck supple.  Neurological:     Mental Status: She is alert and oriented to person, place, and time.     Cranial Nerves: No cranial nerve deficit.  Skin:    General: Skin is warm and dry.  Psychiatric:        Judgment: Judgment normal.  Vitals reviewed.    Assessment:  ANNUAL EXAM 1. Women's annual routine gynecological examination   2. Encounter for removal and reinsertion of Nexplanon   3. ASCUS of cervix with negative high risk HPV      Screening Plan:            1.  Cervical Screening-  Pap smear done today  2. Breast screening- Exam annually and mammogram>40 planned   3. Colonoscopy every 10 years, Hemoccult testing - after age 1  4. Labs managed by PCP  5. Counseling for contraception:  Nexplanon  Upstream - 05/18/21 1424       Pregnancy Intention Screening   Does the patient want to become pregnant in the next year? No    Does the patient's partner want to become pregnant in the next year? No    Would the patient like to discuss contraceptive options today? No       Contraception Wrap Up   Current Method Hormonal Implant    End Method Hormonal Implant    Contraception Counseling Provided No            The pregnancy intention screening data noted above was reviewed. Potential methods of contraception  were discussed. The patient elected to proceed with Hormonal Implant.    Encounter for removal and reinsertion of Nexplanon today    F/U  Return in about 1 year (around 05/18/2022) for Annual.   Nexplanon removal  Patient given informed consent, signed copy in the chart, time out was performed.   Procedure note - The Nexplanon was noted in the patient's arm and the end was identified. The skin was cleansed with a Betadine solution. A small injection (3 cc) of subcutaneous lidocaine with epinephrine was given over the end of the implant. An incision was made at the end of the implant. The rod was noted in the incision and grasped with a hemostat. It was noted to be intact.  Hemostasis was noted.  Nexplanon Insertion Nexplanon removed form packaging,  Device confirmed in needle, then inserted full length of needle and withdrawn per handbook instructions.  Pt insertion site covered with steri-strip and a bandage.   Minimal blood loss.  Pt tolerated the procedure well.   Barnett Applebaum, MD, [facility], [facility] 05/18/2021  3:03 PM

## 2021-05-20 LAB — CYTOLOGY - PAP: Diagnosis: NEGATIVE

## 2021-05-20 NOTE — Telephone Encounter (Signed)
Nexplanon rcvd/charged 05/18/21

## 2021-10-29 ENCOUNTER — Encounter: Payer: Self-pay | Admitting: Obstetrics & Gynecology

## 2022-03-06 DIAGNOSIS — R110 Nausea: Secondary | ICD-10-CM | POA: Diagnosis not present

## 2022-03-06 DIAGNOSIS — T2122XA Burn of second degree of abdominal wall, initial encounter: Secondary | ICD-10-CM | POA: Diagnosis not present

## 2022-03-06 DIAGNOSIS — T310 Burns involving less than 10% of body surface: Secondary | ICD-10-CM | POA: Diagnosis not present

## 2022-03-08 ENCOUNTER — Encounter (HOSPITAL_COMMUNITY): Payer: Self-pay | Admitting: Emergency Medicine

## 2022-03-08 ENCOUNTER — Emergency Department (HOSPITAL_COMMUNITY)
Admission: EM | Admit: 2022-03-08 | Discharge: 2022-03-08 | Disposition: A | Discharge status: Home / Self Care | Payer: BLUE CROSS/BLUE SHIELD | Attending: Emergency Medicine | Admitting: Emergency Medicine

## 2022-03-08 DIAGNOSIS — X100XXA Contact with hot drinks, initial encounter: Secondary | ICD-10-CM | POA: Insufficient documentation

## 2022-03-08 DIAGNOSIS — E876 Hypokalemia: Secondary | ICD-10-CM | POA: Diagnosis not present

## 2022-03-08 DIAGNOSIS — T2102XA Burn of unspecified degree of abdominal wall, initial encounter: Secondary | ICD-10-CM | POA: Diagnosis present

## 2022-03-08 DIAGNOSIS — T2122XA Burn of second degree of abdominal wall, initial encounter: Secondary | ICD-10-CM | POA: Diagnosis not present

## 2022-03-08 DIAGNOSIS — R197 Diarrhea, unspecified: Secondary | ICD-10-CM | POA: Diagnosis not present

## 2022-03-08 DIAGNOSIS — R112 Nausea with vomiting, unspecified: Secondary | ICD-10-CM | POA: Diagnosis not present

## 2022-03-08 LAB — COMPREHENSIVE METABOLIC PANEL
ALT: 47 U/L — ABNORMAL HIGH (ref 0–44)
AST: 46 U/L — ABNORMAL HIGH (ref 15–41)
Albumin: 3.9 g/dL (ref 3.5–5.0)
Alkaline Phosphatase: 93 U/L (ref 38–126)
Anion gap: 11 (ref 5–15)
BUN: 5 mg/dL — ABNORMAL LOW (ref 6–20)
CO2: 22 mmol/L (ref 22–32)
Calcium: 9.1 mg/dL (ref 8.9–10.3)
Chloride: 103 mmol/L (ref 98–111)
Creatinine, Ser: 0.87 mg/dL (ref 0.44–1.00)
GFR, Estimated: >60 mL/min (ref >60)
Glucose, Bld: 107 mg/dL — ABNORMAL HIGH (ref 70–99)
Potassium: 3.2 mmol/L — ABNORMAL LOW (ref 3.5–5.1)
Sodium: 136 mmol/L (ref 135–145)
Total Bilirubin: 0.4 mg/dL (ref 0.3–1.2)
Total Protein: 7.6 g/dL (ref 6.5–8.1)

## 2022-03-08 LAB — CBC
HCT: 41 % (ref 36.0–46.0)
Hemoglobin: 14 g/dL (ref 12.0–15.0)
MCH: 29.6 pg (ref 26.0–34.0)
MCHC: 34.1 g/dL (ref 30.0–36.0)
MCV: 86.7 fL (ref 80.0–100.0)
Platelets: 321 10*3/uL (ref 150–400)
RBC: 4.73 MIL/uL (ref 3.87–5.11)
RDW: 13.3 % (ref 11.5–15.5)
WBC: 6.3 10*3/uL (ref 4.0–10.5)
nRBC: 0 % (ref 0.0–0.2)

## 2022-03-08 LAB — I-STAT BETA HCG BLOOD, ED (MC, WL, AP ONLY): I-stat hCG, quantitative: <5 m[IU]/mL (ref <5)

## 2022-03-08 LAB — URINALYSIS, ROUTINE W REFLEX MICROSCOPIC
Bilirubin Urine: NEGATIVE
Glucose, UA: NEGATIVE mg/dL
Ketones, ur: 20 mg/dL — AB
Leukocytes,Ua: NEGATIVE
Nitrite: NEGATIVE
Protein, ur: 30 mg/dL — AB
Specific Gravity, Urine: 1.013 (ref 1.005–1.030)
pH: 5 (ref 5.0–8.0)

## 2022-03-08 LAB — LIPASE, BLOOD: Lipase: 26 U/L (ref 11–51)

## 2022-03-08 MED ORDER — LACTATED RINGERS IV BOLUS
1000.0000 mL | Freq: Once | INTRAVENOUS | Status: AC
  Administered 2022-03-08: 1000 mL via INTRAVENOUS

## 2022-03-08 MED ORDER — POTASSIUM CHLORIDE CRYS ER 20 MEQ PO TBCR
40.0000 meq | EXTENDED_RELEASE_TABLET | Freq: Once | ORAL | Status: AC
  Administered 2022-03-08: 40 meq via ORAL
  Filled 2022-03-08: qty 2

## 2022-03-08 MED ORDER — ONDANSETRON HCL 4 MG PO TABS: 4.0000 mg | ORAL_TABLET | Freq: Four times a day (QID) | ORAL | 0 refills | Status: DC

## 2022-03-08 MED ORDER — ONDANSETRON HCL 4 MG/2ML IJ SOLN
4.0000 mg | Freq: Once | INTRAMUSCULAR | Status: AC
  Administered 2022-03-08: 4 mg via INTRAVENOUS
  Filled 2022-03-08: qty 2

## 2022-03-08 NOTE — Discharge Instructions (Addendum)
You were evaluated in the Emergency Department and after careful evaluation, we did not find any emergent condition requiring admission or further testing in the hospital.  Your work-up today was overall reassuring, your liver function tests were just slightly elevated but I suspect this is due to your vomiting and dehydration.  Your stool was sent off for a pathogen panel which may take several days to result.  The results will be available via MyChart.  If this is positive and you will require antibiotics, our pharmacy team will reach out to you.  Please advance her diet slowly, see the handout on ideas for bland diet.    Please return to the Emergency Department if you experience any worsening of your condition.  We encourage you to follow up with a primary care provider.  Thank you for allowing [place] to be a part of your care.

## 2022-03-08 NOTE — ED Notes (Signed)
RN gave pt gingerale
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
## 2022-03-09 LAB — GASTROINTESTINAL PANEL BY PCR, STOOL (REPLACES STOOL CULTURE)
Adenovirus F40/41: NOT DETECTED
Astrovirus: NOT DETECTED
Campylobacter species: NOT DETECTED
Cryptosporidium: DETECTED — AB
Cyclospora cayetanensis: NOT DETECTED
Entamoeba histolytica: NOT DETECTED
Enteroaggregative E coli (EAEC): NOT DETECTED
Enteropathogenic E coli (EPEC): DETECTED — AB
Enterotoxigenic E coli (ETEC): NOT DETECTED
Giardia lamblia: NOT DETECTED
Norovirus GI/GII: NOT DETECTED
Plesimonas shigelloides: NOT DETECTED
Rotavirus A: NOT DETECTED
Salmonella species: NOT DETECTED
Sapovirus (I, II, IV, and V): NOT DETECTED
Shiga like toxin producing E coli (STEC): NOT DETECTED
Shigella/Enteroinvasive E coli (EIEC): NOT DETECTED
Vibrio cholerae: NOT DETECTED
Vibrio species: NOT DETECTED
Yersinia enterocolitica: NOT DETECTED

## 2022-03-10 ENCOUNTER — Telehealth (HOSPITAL_COMMUNITY): Payer: Self-pay | Admitting: Physician Assistant

## 2022-03-10 ENCOUNTER — Encounter: Payer: Self-pay | Admitting: Nurse Practitioner

## 2022-03-10 ENCOUNTER — Ambulatory Visit: Payer: Medicaid Other | Admitting: Nurse Practitioner

## 2022-03-10 ENCOUNTER — Other Ambulatory Visit: Payer: Self-pay

## 2022-03-10 ENCOUNTER — Ambulatory Visit: Payer: Self-pay | Admitting: *Deleted

## 2022-03-10 VITALS — BP 118/72 | HR 97 | Temp 98.2°F | Resp 18 | Ht 67.0 in | Wt 237.9 lb

## 2022-03-10 DIAGNOSIS — R739 Hyperglycemia, unspecified: Secondary | ICD-10-CM | POA: Diagnosis not present

## 2022-03-10 DIAGNOSIS — R311 Benign essential microscopic hematuria: Secondary | ICD-10-CM

## 2022-03-10 DIAGNOSIS — A072 Cryptosporidiosis: Secondary | ICD-10-CM

## 2022-03-10 DIAGNOSIS — A041 Enterotoxigenic Escherichia coli infection: Secondary | ICD-10-CM

## 2022-03-10 DIAGNOSIS — T300 Burn of unspecified body region, unspecified degree: Secondary | ICD-10-CM

## 2022-03-10 LAB — POCT URINALYSIS DIPSTICK
Bilirubin, UA: NEGATIVE
Glucose, UA: NEGATIVE
Nitrite, UA: NEGATIVE
Protein, UA: POSITIVE — AB
Spec Grav, UA: 1.02 (ref 1.010–1.025)
Urobilinogen, UA: 0.2 E.U./dL
pH, UA: 5 (ref 5.0–8.0)

## 2022-03-10 LAB — GLUCOSE, POCT (MANUAL RESULT ENTRY): POC Glucose: 92 mg/dl (ref 70–99)

## 2022-03-10 MED ORDER — NITAZOXANIDE 500 MG PO TABS: 500.0000 mg | ORAL_TABLET | Freq: Two times a day (BID) | ORAL | 0 refills | Status: AC

## 2022-03-10 MED ORDER — AZITHROMYCIN 500 MG PO TABS: 1000.0000 mg | ORAL_TABLET | Freq: Once | ORAL | 0 refills | Status: AC

## 2022-03-10 MED ORDER — CIPROFLOXACIN HCL 500 MG PO TABS: 500.0000 mg | ORAL_TABLET | Freq: Two times a day (BID) | ORAL | 0 refills | Status: AC

## 2022-03-10 MED ORDER — NITAZOXANIDE 500 MG PO TABS: 500.0000 mg | ORAL_TABLET | Freq: Two times a day (BID) | ORAL | 0 refills | Status: DC
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
## 2022-03-10 NOTE — Telephone Encounter (Signed)
  Chief Complaint: vomiting, diarrhea Symptoms: vomiting, diarrhea- has been to UC and ED Frequency: Started Sunday Pertinent Negatives: Patient denies abdominal pain Disposition: '[]'$ ED /'[]'$ Urgent Care (no appt availability in office) / '[x]'$ Appointment(In office/virtual)/ '[]'$  [facility]/ '[]'$ Home Care/ '[]'$ Refused Recommended Disposition /'[]'$ [facility]/ '[]'$  Follow-up with PCP Additional Notes: Office called patient during triage and appointment has been scheduled.  Reason for Disposition  [1] MILD or MODERATE vomiting AND [2] present > 48 hours (2 days) (Exception: Mild vomiting with associated diarrhea.)  Answer Assessment - Initial Assessment Questions 1. VOMITING SEVERITY: "How many times have you vomited in the past 24 hours?"     - MILD:  1 - 2 times/day    - MODERATE: 3 - 5 times/day, decreased oral intake without significant weight loss or symptoms of dehydration    - SEVERE: 6 or more times/day, vomits everything or nearly everything, with significant weight loss, symptoms of dehydration      moderate 2. ONSET: "When did the vomiting begin?"      Started Monday 3. FLUIDS: "What fluids or food have you vomited up today?" "Have you been able to keep any fluids down?"     Feels like everything goes right through 4. ABDOMEN PAIN: "Are your having any abdomen pain?" If Yes : "How bad is it and what does it feel like?" (e.g., crampy, dull, intermittent, constant)      no 5. DIARRHEA: "Is there any diarrhea?" If Yes, ask: "How many times today?"      Yes- started Sunday- everything goes right through 6. CONTACTS: "Is there anyone else in the family with the same symptoms?"      *No Answer* 7. CAUSE: "What do you think is causing your vomiting?"     Intestinal bacteria/infection 8. HYDRATION STATUS: "Any signs of dehydration?" (e.g., dry mouth [not only dry lips], too weak to stand) "When did you last urinate?"     *No Answer* 9. OTHER SYMPTOMS: "Do you have any other  symptoms?" (e.g., fever, headache, vertigo, vomiting blood or coffee grounds, recent head injury)     *No Answer* 10. PREGNANCY: "Is there any chance you are pregnant?" "When was your last menstrual period?"       *No Answer*  Protocols used: Vomiting-A-AH

## 2022-03-10 NOTE — Telephone Encounter (Signed)
Received correspondence from Aretta Nip RN regarding this patient's positive GI panel.  I reviewed the results, positive for E. coli and Cryptosporidium.  Discussed treatment course with pharmacy.  Plan to treat Cryptosporidium with Nitazoxinde (called [facility] on [address], not in stock but will order medication) and ciprofloxacin for Ecoli. Called the patient back and left another voicemail letting her know that I called in those medications into [facility] and left callback number.

## 2022-03-11 LAB — URINE CULTURE
MICRO NUMBER:: 13762882
Result:: NO GROWTH
SPECIMEN QUALITY:: ADEQUATE

## 2022-03-14 ENCOUNTER — Encounter: Payer: Self-pay | Admitting: Nurse Practitioner

## 2022-03-18 ENCOUNTER — Other Ambulatory Visit: Payer: Self-pay | Admitting: Nurse Practitioner

## 2022-03-18 ENCOUNTER — Encounter: Payer: Self-pay | Admitting: Nurse Practitioner

## 2022-03-18 DIAGNOSIS — T3695XA Adverse effect of unspecified systemic antibiotic, initial encounter: Secondary | ICD-10-CM

## 2022-03-18 MED ORDER — FLUCONAZOLE 150 MG PO TABS: 150.0000 mg | ORAL_TABLET | ORAL | 0 refills | Status: DC | PRN

## 2022-05-23 ENCOUNTER — Telehealth: Payer: BLUE CROSS/BLUE SHIELD | Admitting: Physician Assistant

## 2022-05-23 DIAGNOSIS — J208 Acute bronchitis due to other specified organisms: Secondary | ICD-10-CM

## 2022-05-23 DIAGNOSIS — B9689 Other specified bacterial agents as the cause of diseases classified elsewhere: Secondary | ICD-10-CM

## 2022-05-23 MED ORDER — BENZONATATE 100 MG PO CAPS: 100.0000 mg | ORAL_CAPSULE | Freq: Three times a day (TID) | ORAL | 0 refills | Status: DC | PRN

## 2022-05-23 MED ORDER — DOXYCYCLINE HYCLATE 100 MG PO TABS: 100.0000 mg | ORAL_TABLET | Freq: Two times a day (BID) | ORAL | 0 refills | Status: DC

## 2022-05-23 NOTE — Progress Notes (Signed)

## 2022-05-23 NOTE — Progress Notes (Signed)
I have spent 5 minutes in review of e-visit questionnaire, review and updating patient chart, medical decision making and response to patient.   Aniylah Avans Cody Ashlynn Gunnels, PA-C    

## 2022-05-23 NOTE — Progress Notes (Signed)
Message sent to patient requesting further input regarding current symptoms. Awaiting patient response.  

## 2022-06-07 NOTE — Progress Notes (Unsigned)
Name: Anna Beck   MRN: [id number]    DOB: [birth year]   Date:06/08/2022       Progress Note  Subjective  Chief Complaint  Follow Up  HPI   Dysthymia: she states she was diagnosed with post-partum depression after the birth of her first child - son  55 years ago, had a daughter almost 4 years ago and is still with her father, however states not libido, she has no motivation, she works from home and does not do much of anything outside of her house. She had a couple sessions of therapy and try journaling but difficulty taking time off work . She does not like taking medications but wants to feel better   Vitamin D def: she is only taking MVI, needs to take otc vitamin D 2000 units daily   B12 towards low end of normal: advised her to take SL B12 1000 mcg a few days a week.   Obesity: she states her weight was at most 150 lbs before she got pregnant, her weight was close to 200 lbs after she had her first child, she lost weight during second pregnancy due to morning sickness, weight went down from 215 lbs to 180 lbs, but has been gradually gaining weight again and today is her heaviest weight at 240 lbs. She goes outside to play with her daughter and also has a stepper at home , she is trying to eat more vegetables , drinking more water, avoiding sodas   Sciatica left side: she had scoliosis surgery at age 39 and after the birth of her son about 14 years ago she developed sciatica on left side, she continues to have pain that shoots down to left posterior leg. No bowel or bladder incontinence. She takes Tylenol prn. Discussed referral to PT but she prefers trying home exercises for now   Patient Active Problem List   Diagnosis Date Noted   Chondromalacia of left patella 03/28/2019   Vitamin D deficiency 02/27/2019   BMI 35.0-35.9,adult 02/27/2019   ASCUS of cervix with negative high risk HPV 02/27/2019   Closed fracture of base of fifth metatarsal bone 02/25/2019   Closed fracture of  lateral malleolus 02/25/2019   Menometrorrhagia 10/31/2018   History of uterine fibroid 10/31/2018    Past Surgical History:  Procedure Laterality Date   CHOLECYSTECTOMY     DG SCOLIOSIS STUDY ENTIRE SPINE ([facility] HX)     States corrective surgery of scoliosis   ROOT CANAL     SMALL INTESTINE SURGERY  1986   Pyloric stenosis   SPINE SURGERY  1997   TONSILLECTOMY Bilateral 05/18/2018   Procedure: TONSILLECTOMY;  Surgeon: Beverly Gust, MD;  Location: [facility];  Service: ENT;  Laterality: Bilateral;    Family History  Problem Relation Age of Onset   Hypertension Mother    Depression Mother    Alcohol abuse Father    Asthma Sister    Heart disease Maternal Grandmother    Alzheimer's disease Paternal Grandmother    Depression Sister     Social History   Tobacco Use   Smoking status: Never   Smokeless tobacco: Never   Tobacco comments:    age 17 "tried it, didn't like it"  Substance Use Topics   Alcohol use: Yes    Comment: Occasionally    No current outpatient medications on file.  Allergies  Allergen Reactions   Percocet [Oxycodone-Acetaminophen] Itching    I personally reviewed active problem list, medication list, allergies, family history,  social history, health maintenance with the patient/caregiver today.   ROS  Constitutional: Negative for fever or weight change.  Respiratory: Negative for cough and shortness of breath.   Cardiovascular: Negative for chest pain or palpitations.  Gastrointestinal: Negative for abdominal pain, no bowel changes.  Musculoskeletal: Negative for gait problem or joint swelling.  Skin: Negative for rash.  Neurological: Negative for dizziness or headache.  No other specific complaints in a complete review of systems (except as listed in HPI above).   Objective  Vitals:   06/08/22 1335  BP: 118/72  Pulse: (!) 101  Resp: 16  SpO2: 99%  Weight: 242 lb (109.8 kg)  Height: '5\' 7"'$  (1.702 m)    Body mass index  is 37.9 kg/m.  Physical Exam  Constitutional: Patient appears well-developed and well-nourished. Obese  No distress.  HEENT: head atraumatic, normocephalic, pupils equal and reactive to light, neck supple Cardiovascular: Normal rate, regular rhythm and normal heart sounds.  No murmur heard. No BLE edema. Pulmonary/Chest: Effort normal and breath sounds normal. No respiratory distress. Abdominal: Soft.  There is no tenderness. Muscular skeletal: normal rom of spine, left hip is higher than right side.  Psychiatric: Patient has a normal mood and affect. behavior is normal. Judgment and thought content normal.   PHQ2/9:    06/08/2022    1:34 PM 03/10/2022    1:09 PM 12/30/2020   10:35 AM 12/11/2020   11:04 AM 07/21/2020   11:19 AM  Depression screen PHQ 2/9  Decreased Interest 1 0  1 0  Down, Depressed, Hopeless 1 0  1 0  PHQ - 2 Score 2 0  2 0  Altered sleeping 1   1   Tired, decreased energy 3   1   Change in appetite 0   0   Feeling bad or failure about yourself  3   1   Trouble concentrating 1   0   Moving slowly or fidgety/restless 0   0   Suicidal thoughts 0   0   PHQ-9 Score 10   5   Difficult doing work/chores    Somewhat difficult      Information is confidential and restricted. Go to Review Flowsheets to unlock data.    phq 9 is positive   Fall Risk:    06/08/2022    1:34 PM 03/10/2022    1:09 PM 12/11/2020   11:04 AM 07/21/2020   11:19 AM 05/23/2019   11:10 AM  Fall Risk   Falls in the past year? 0 0 0 0 1  Number falls in past yr: 0 0  0 1  Injury with Fall? 0 0  0 1  Risk for fall due to : No Fall Risks      Follow up Falls prevention discussed Falls evaluation completed   Falls evaluation completed      Functional Status Survey: Is the patient deaf or have difficulty hearing?: No Does the patient have difficulty seeing, even when wearing glasses/contacts?: No Does the patient have difficulty concentrating, remembering, or making decisions?: Yes Does  the patient have difficulty walking or climbing stairs?: Yes Does the patient have difficulty dressing or bathing?: Yes Does the patient have difficulty doing errands alone such as visiting a doctor's office or shopping?: No    Assessment & Plan  1. Dysthymia  - CBC with Differential/Platelet - COMPLETE METABOLIC PANEL WITH GFR - TSH - DULoxetine (CYMBALTA) 30 MG capsule; Take 1 capsule (30 mg total) by mouth daily.  Dispense: 30 capsule; Refill: 0 - DULoxetine (CYMBALTA) 60 MG capsule; Take 1 capsule (60 mg total) by mouth daily.  Dispense: 30 capsule; Refill: 0  2. Elevated liver enzymes  - COMPLETE METABOLIC PANEL WITH GFR  3. Elevated blood sugar  - Hemoglobin A1c  4. Vitamin D deficiency   5. Low serum vitamin B12   6. Sciatica, left side  - DULoxetine (CYMBALTA) 30 MG capsule; Take 1 capsule (30 mg total) by mouth daily.  Dispense: 30 capsule; Refill: 0  7. Seborrhea capitis  - selenium sulfide (SELSUN) 2.5 % shampoo; Apply 1 Application topically daily as needed for irritation.  Dispense: 118 mL; Refill: 12

## 2022-06-08 ENCOUNTER — Encounter: Payer: Self-pay | Admitting: Family Medicine

## 2022-06-08 ENCOUNTER — Ambulatory Visit: Payer: Medicaid Other | Admitting: Family Medicine

## 2022-06-08 VITALS — BP 118/72 | HR 101 | Resp 16 | Ht 67.0 in | Wt 242.0 lb

## 2022-06-08 DIAGNOSIS — M5432 Sciatica, left side: Secondary | ICD-10-CM | POA: Diagnosis not present

## 2022-06-08 DIAGNOSIS — E559 Vitamin D deficiency, unspecified: Secondary | ICD-10-CM | POA: Diagnosis not present

## 2022-06-08 DIAGNOSIS — R748 Abnormal levels of other serum enzymes: Secondary | ICD-10-CM

## 2022-06-08 DIAGNOSIS — E538 Deficiency of other specified B group vitamins: Secondary | ICD-10-CM | POA: Diagnosis not present

## 2022-06-08 DIAGNOSIS — L210 Seborrhea capitis: Secondary | ICD-10-CM

## 2022-06-08 DIAGNOSIS — R739 Hyperglycemia, unspecified: Secondary | ICD-10-CM | POA: Diagnosis not present

## 2022-06-08 DIAGNOSIS — F341 Dysthymic disorder: Secondary | ICD-10-CM

## 2022-06-08 MED ORDER — DULOXETINE HCL 30 MG PO CPEP: 30.0000 mg | ORAL_CAPSULE | Freq: Every day | ORAL | 0 refills | Status: DC

## 2022-06-08 MED ORDER — SELENIUM SULFIDE 2.5 % EX LOTN: 1.0000 | TOPICAL_LOTION | Freq: Every day | CUTANEOUS | 12 refills | Status: DC | PRN

## 2022-06-08 MED ORDER — DULOXETINE HCL 60 MG PO CPEP: 60.0000 mg | ORAL_CAPSULE | Freq: Every day | ORAL | 0 refills | Status: DC

## 2022-06-08 NOTE — Patient Instructions (Signed)
Please only take duloxetine 30 mg first after that get the 60 mg dose  Back Exercises The following exercises strengthen the muscles that help to support the trunk (torso) and back. They also help to keep the lower back flexible. Doing these exercises can help to prevent or lessen existing low back pain. If you have back pain or discomfort, try doing these exercises 2-3 times each day or as told by your health care provider. As your pain improves, do them once each day, but increase the number of times that you repeat the steps for each exercise (do more repetitions). To prevent the recurrence of back pain, continue to do these exercises once each day or as told by your health care provider. Do exercises exactly as told by your health care provider and adjust them as directed. It is normal to feel mild stretching, pulling, tightness, or discomfort as you do these exercises, but you should stop right away if you feel sudden pain or your pain gets worse. Exercises Single knee to chest Repeat these steps 3-5 times for each leg: Lie on your back on a firm bed or the floor with your legs extended. Bring one knee to your chest. Your other leg should stay extended and in contact with the floor. Hold your knee in place by grabbing your knee or thigh with both hands and hold. Pull on your knee until you feel a gentle stretch in your lower back or buttocks. Hold the stretch for 10-30 seconds. Slowly release and straighten your leg.  Pelvic tilt Repeat these steps 5-10 times: Lie on your back on a firm bed or the floor with your legs extended. Bend your knees so they are pointing toward the ceiling and your feet are flat on the floor. Tighten your lower abdominal muscles to press your lower back against the floor. This motion will tilt your pelvis so your tailbone points up toward the ceiling instead of pointing to your feet or the floor. With gentle tension and even breathing, hold this position for  5-10 seconds.  Cat-cow Repeat these steps until your lower back becomes more flexible: Get into a hands-and-knees position on a firm bed or the floor. Keep your hands under your shoulders, and keep your knees under your hips. You may place padding under your knees for comfort. Let your head hang down toward your chest. Contract your abdominal muscles and point your tailbone toward the floor so your lower back becomes rounded like the back of a cat. Hold this position for 5 seconds. Slowly lift your head, let your abdominal muscles relax, and point your tailbone up toward the ceiling so your back forms a sagging arch like the back of a cow. Hold this position for 5 seconds.  Press-ups Repeat these steps 5-10 times: Lie on your abdomen (face-down) on a firm bed or the floor. Place your palms near your head, about shoulder-width apart. Keeping your back as relaxed as possible and keeping your hips on the floor, slowly straighten your arms to raise the top half of your body and lift your shoulders. Do not use your back muscles to raise your upper torso. You may adjust the placement of your hands to make yourself more comfortable. Hold this position for 5 seconds while you keep your back relaxed. Slowly return to lying flat on the floor.  Bridges Repeat these steps 10 times: Lie on your back on a firm bed or the floor. Bend your knees so they are pointing toward  the ceiling and your feet are flat on the floor. Your arms should be flat at your sides, next to your body. Tighten your buttocks muscles and lift your buttocks off the floor until your waist is at almost the same height as your knees. You should feel the muscles working in your buttocks and the back of your thighs. If you do not feel these muscles, slide your feet 1-2 inches (2.5-5 cm) farther away from your buttocks. Hold this position for 3-5 seconds. Slowly lower your hips to the starting position, and allow your buttocks muscles to  relax completely. If this exercise is too easy, try doing it with your arms crossed over your chest. Abdominal crunches Repeat these steps 5-10 times: Lie on your back on a firm bed or the floor with your legs extended. Bend your knees so they are pointing toward the ceiling and your feet are flat on the floor. Cross your arms over your chest. Tip your chin slightly toward your chest without bending your neck. Tighten your abdominal muscles and slowly raise your torso high enough to lift your shoulder blades a tiny bit off the floor. Avoid raising your torso higher than that because it can put too much stress on your lower back and does not help to strengthen your abdominal muscles. Slowly return to your starting position.  Back lifts Repeat these steps 5-10 times: Lie on your abdomen (face-down) with your arms at your sides, and rest your forehead on the floor. Tighten the muscles in your legs and your buttocks. Slowly lift your chest off the floor while you keep your hips pressed to the floor. Keep the back of your head in line with the curve in your back. Your eyes should be looking at the floor. Hold this position for 3-5 seconds. Slowly return to your starting position.  Contact a health care provider if: Your back pain or discomfort gets much worse when you do an exercise. Your worsening back pain or discomfort does not lessen within 2 hours after you exercise. If you have any of these problems, stop doing these exercises right away. Do not do them again unless your health care provider says that you can. Get help right away if: You develop sudden, severe back pain. If this happens, stop doing the exercises right away. Do not do them again unless your health care provider says that you can. This information is not intended to replace advice given to you by your health care provider. Make sure you discuss any questions you have with your health care provider. Document Revised:  01/12/2021 Document Reviewed: 09/30/2020 Elsevier Patient Education  [employer] [place].

## 2022-06-09 LAB — CBC WITH DIFFERENTIAL/PLATELET
Absolute Monocytes: 450 {cells}/uL (ref 200–950)
Basophils Absolute: 40 {cells}/uL (ref 0–200)
Basophils Relative: 0.5 %
Eosinophils Absolute: 79 {cells}/uL (ref 15–500)
Eosinophils Relative: 1 %
HCT: 36.8 % (ref 35.0–45.0)
Hemoglobin: 12.2 g/dL (ref 11.7–15.5)
Lymphs Abs: 1920 {cells}/uL (ref 850–3900)
MCH: 29.8 pg (ref 27.0–33.0)
MCHC: 33.2 g/dL (ref 32.0–36.0)
MCV: 89.8 fL (ref 80.0–100.0)
MPV: 10.1 fL (ref 7.5–12.5)
Monocytes Relative: 5.7 %
Neutro Abs: 5412 {cells}/uL (ref 1500–7800)
Neutrophils Relative %: 68.5 %
Platelets: 310 Thousand/uL (ref 140–400)
RBC: 4.1 Million/uL (ref 3.80–5.10)
RDW: 13.5 % (ref 11.0–15.0)
Total Lymphocyte: 24.3 %
WBC: 7.9 Thousand/uL (ref 3.8–10.8)

## 2022-06-09 LAB — COMPLETE METABOLIC PANEL WITH GFR
AG Ratio: 1.6 (calc) (ref 1.0–2.5)
ALT: 11 U/L (ref 6–29)
AST: 14 U/L (ref 10–30)
Albumin: 4.2 g/dL (ref 3.6–5.1)
Alkaline phosphatase (APISO): 65 U/L (ref 31–125)
BUN/Creatinine Ratio: 8 (calc) (ref 6–22)
BUN: 5 mg/dL — ABNORMAL LOW (ref 7–25)
CO2: 30 mmol/L (ref 20–32)
Calcium: 9.5 mg/dL (ref 8.6–10.2)
Chloride: 107 mmol/L (ref 98–110)
Creat: 0.62 mg/dL (ref 0.50–0.97)
Globulin: 2.7 g/dL (calc) (ref 1.9–3.7)
Glucose, Bld: 95 mg/dL (ref 65–99)
Potassium: 3.9 mmol/L (ref 3.5–5.3)
Sodium: 144 mmol/L (ref 135–146)
Total Bilirubin: 0.5 mg/dL (ref 0.2–1.2)
Total Protein: 6.9 g/dL (ref 6.1–8.1)
eGFR: 117 mL/min/{1.73_m2} (ref >=60)

## 2022-06-09 LAB — HEMOGLOBIN A1C
Hgb A1c MFr Bld: 5.3 %{Hb}
Mean Plasma Glucose: 105 mg/dL
eAG (mmol/L): 5.8 mmol/L

## 2022-06-09 LAB — TSH: TSH: 1.63 mIU/L

## 2022-06-30 ENCOUNTER — Other Ambulatory Visit: Payer: Self-pay | Admitting: Family Medicine

## 2022-06-30 DIAGNOSIS — F341 Dysthymic disorder: Secondary | ICD-10-CM

## 2022-06-30 DIAGNOSIS — M5432 Sciatica, left side: Secondary | ICD-10-CM

## 2022-08-10 ENCOUNTER — Ambulatory Visit: Payer: Medicaid Other | Admitting: Family Medicine

## 2022-09-20 DIAGNOSIS — M79601 Pain in right arm: Secondary | ICD-10-CM | POA: Diagnosis not present

## 2022-09-20 DIAGNOSIS — M542 Cervicalgia: Secondary | ICD-10-CM | POA: Diagnosis not present

## 2022-11-11 ENCOUNTER — Other Ambulatory Visit: Payer: Self-pay | Admitting: Family Medicine

## 2022-11-11 DIAGNOSIS — F341 Dysthymic disorder: Secondary | ICD-10-CM

## 2022-11-11 DIAGNOSIS — M5432 Sciatica, left side: Secondary | ICD-10-CM

## 2023-01-14 DIAGNOSIS — H6993 Unspecified Eustachian tube disorder, bilateral: Secondary | ICD-10-CM | POA: Diagnosis not present

## 2023-01-14 DIAGNOSIS — H6693 Otitis media, unspecified, bilateral: Secondary | ICD-10-CM | POA: Diagnosis not present

## 2023-03-22 ENCOUNTER — Telehealth: Payer: Self-pay

## 2023-03-22 ENCOUNTER — Telehealth: Payer: Self-pay | Admitting: Family Medicine

## 2023-03-22 NOTE — Telephone Encounter (Signed)
Called number on file and lvm for pt to return call to schedule appt

## 2023-03-22 NOTE — Telephone Encounter (Signed)
Patient called in stating she does not understand why she needs to schedule an appt when the FMLA forms are for to miss work to take her mother to appts. Please contact patient if she still needs to schedule the appt.

## 2023-03-22 NOTE — Telephone Encounter (Signed)
Patient's mother sent over FMLA forms to cover [place] for any missed work. She hasn't been seen in a while and will need an appointment to complete these forms. Can you call and schedule her a paperwork completion appointment? Thank you!

## 2023-03-22 NOTE — Telephone Encounter (Signed)
Lvm to make appt

## 2023-06-11 ENCOUNTER — Encounter: Payer: Self-pay | Admitting: Family Medicine

## 2023-06-13 ENCOUNTER — Other Ambulatory Visit: Payer: Self-pay | Admitting: Family Medicine

## 2023-06-13 DIAGNOSIS — L210 Seborrhea capitis: Secondary | ICD-10-CM

## 2023-06-13 MED ORDER — SELENIUM SULFIDE 2.5 % EX LOTN: 1.0000 | TOPICAL_LOTION | Freq: Every day | CUTANEOUS | 0 refills | Status: DC | PRN

## 2023-09-06 DIAGNOSIS — M5442 Lumbago with sciatica, left side: Secondary | ICD-10-CM | POA: Diagnosis not present

## 2023-09-06 DIAGNOSIS — M5441 Lumbago with sciatica, right side: Secondary | ICD-10-CM | POA: Diagnosis not present

## 2023-09-06 DIAGNOSIS — M25511 Pain in right shoulder: Secondary | ICD-10-CM | POA: Diagnosis not present

## 2023-09-06 DIAGNOSIS — M791 Myalgia, unspecified site: Secondary | ICD-10-CM | POA: Diagnosis not present

## 2023-10-09 DIAGNOSIS — R112 Nausea with vomiting, unspecified: Secondary | ICD-10-CM | POA: Diagnosis not present

## 2023-10-09 DIAGNOSIS — R197 Diarrhea, unspecified: Secondary | ICD-10-CM | POA: Diagnosis not present

## 2023-10-09 DIAGNOSIS — R1013 Epigastric pain: Secondary | ICD-10-CM | POA: Diagnosis not present

## 2023-10-13 ENCOUNTER — Other Ambulatory Visit: Payer: Self-pay | Admitting: Family Medicine

## 2023-10-13 DIAGNOSIS — L210 Seborrhea capitis: Secondary | ICD-10-CM

## 2023-12-01 ENCOUNTER — Encounter: Payer: Self-pay | Admitting: Family Medicine

## 2024-01-09 ENCOUNTER — Encounter: Payer: Self-pay | Admitting: Family Medicine

## 2024-01-17 ENCOUNTER — Encounter: Payer: Self-pay | Admitting: Family Medicine

## 2024-01-17 ENCOUNTER — Ambulatory Visit (INDEPENDENT_AMBULATORY_CARE_PROVIDER_SITE_OTHER): Admitting: Family Medicine

## 2024-01-17 ENCOUNTER — Telehealth: Payer: Self-pay

## 2024-01-17 ENCOUNTER — Other Ambulatory Visit (HOSPITAL_COMMUNITY): Payer: Self-pay

## 2024-01-17 VITALS — BP 126/78 | HR 89 | Temp 98.5°F | Resp 18 | Ht 67.0 in | Wt 243.0 lb

## 2024-01-17 DIAGNOSIS — L210 Seborrhea capitis: Secondary | ICD-10-CM

## 2024-01-17 DIAGNOSIS — E559 Vitamin D deficiency, unspecified: Secondary | ICD-10-CM | POA: Diagnosis not present

## 2024-01-17 DIAGNOSIS — Z6835 Body mass index (BMI) 35.0-35.9, adult: Secondary | ICD-10-CM | POA: Diagnosis not present

## 2024-01-17 DIAGNOSIS — Z79899 Other long term (current) drug therapy: Secondary | ICD-10-CM

## 2024-01-17 DIAGNOSIS — E786 Lipoprotein deficiency: Secondary | ICD-10-CM | POA: Diagnosis not present

## 2024-01-17 DIAGNOSIS — E538 Deficiency of other specified B group vitamins: Secondary | ICD-10-CM | POA: Diagnosis not present

## 2024-01-17 DIAGNOSIS — Z131 Encounter for screening for diabetes mellitus: Secondary | ICD-10-CM

## 2024-01-17 MED ORDER — KETOCONAZOLE 2 % EX SHAM: 1.0000 | MEDICATED_SHAMPOO | CUTANEOUS | 0 refills | Status: AC

## 2024-01-17 MED ORDER — KETOCONAZOLE 2 % EX SHAM: 1.0000 | MEDICATED_SHAMPOO | CUTANEOUS | 0 refills | Status: DC

## 2024-01-17 MED ORDER — TIRZEPATIDE-WEIGHT MANAGEMENT 2.5 MG/0.5ML ~~LOC~~ SOLN: 2.5000 mg | SUBCUTANEOUS | 0 refills | Status: DC

## 2024-01-17 NOTE — Telephone Encounter (Signed)
 Pharmacy Patient Advocate Encounter   Received notification from Onbase that prior authorization for Zepbound 2.5MG /0.5ML pen-injectors  is required/requested.   Insurance verification completed.   The patient is insured through [facility] .   Per test claim: PA required; PA submitted to above mentioned insurance via CoverMyMeds Key/confirmation #/EOC [id number] Status is pending

## 2024-01-17 NOTE — Progress Notes (Signed)
 Name: Anna Beck   MRN: [id number]    DOB: [birth year]   Date:01/17/2024       Progress Note  Subjective  Chief Complaint  Chief Complaint  Patient presents with   Obesity    Concerned about PCOS weight gain despite weight loss attempts, acne, and irregular periods.  Wants to try weight loss med   Discussed the use of AI scribe software for clinical note transcription with the patient, who gave verbal consent to proceed.  History of Present Illness LENORIA NARINE is a 38 year old female with obesity and dysthymia who presents for weight management and mental health support.  She is experiencing difficulty losing weight with a BMI over 30, currently weighing 243 pounds.she has decreased portion size, eating more protein, she has a food log, exercising on a regular basis but weight has not changed in the past two years. She was around 150 lbs prior to having children . She is interested in trying medication to assist with weight loss   She has dysthymia and feels 'a little bit down.' Previously on Cymbalta , she has not taken it recently and prefers therapy over medication at this time. Her depression score is 3, indicating mild symptoms.  She has a history of vitamin B12 deficiency, low HDL cholesterol, and vitamin D  deficiency. Her diet typically includes fruit, bacon, sausage, eggs, and occasionally waffles for breakfast.  She has a history of ASCUS on her Pap smear, with the last test in 2022 being normal.   Seborrhea capitis , she would like a refill of medication  Patient Active Problem List   Diagnosis Date Noted   Chondromalacia of left patella 03/28/2019   Vitamin D  deficiency 02/27/2019   BMI 35.0-35.9,adult 02/27/2019   ASCUS of cervix with negative high risk HPV 02/27/2019   Closed fracture of base of fifth metatarsal bone 02/25/2019   Closed fracture of lateral malleolus 02/25/2019   Menometrorrhagia 10/31/2018   History of uterine fibroid 10/31/2018    Past  Surgical History:  Procedure Laterality Date   CHOLECYSTECTOMY     DG SCOLIOSIS STUDY ENTIRE SPINE ([facility] HX)     States corrective surgery of scoliosis   ROOT CANAL     SMALL INTESTINE SURGERY  1986   Pyloric stenosis   SPINE SURGERY  1997   TONSILLECTOMY Bilateral 05/18/2018   Procedure: TONSILLECTOMY;  Surgeon: Lesly Raspberry, MD;  Location: [facility];  Service: ENT;  Laterality: Bilateral;    Family History  Problem Relation Age of Onset   Hypertension Mother    Depression Mother    Alcohol abuse Father    Asthma Sister    Heart disease Maternal Grandmother    Alzheimer's disease Paternal Grandmother    Depression Sister     Social History   Tobacco Use   Smoking status: Never   Smokeless tobacco: Never   Tobacco comments:    age 35 tried it, didn't like it  Substance Use Topics   Alcohol use: Yes    Comment: Occasionally     Current Outpatient Medications:    DULoxetine  (CYMBALTA ) 30 MG capsule, Take 1 capsule (30 mg total) by mouth daily. (Patient not taking: Reported on 01/17/2024), Disp: 30 capsule, Rfl: 0   DULoxetine  (CYMBALTA ) 60 MG capsule, Take 1 capsule (60 mg total) by mouth daily. (Patient not taking: Reported on 01/17/2024), Disp: 30 capsule, Rfl: 0   selenium  sulfide (SELSUN ) 2.5 % lotion, Apply 1 Application topically daily as needed for irritation., Disp:  118 mL, Rfl: 0  Allergies  Allergen Reactions   Percocet [Oxycodone -Acetaminophen ] Itching    I personally reviewed active problem list, medication list, allergies with the patient/caregiver today.   ROS  Ten systems reviewed and is negative except as mentioned in HPI    Objective Physical Exam  CONSTITUTIONAL: Patient appears well-developed and well-nourished.  No distress. HEENT: Head atraumatic, normocephalic, neck supple. CARDIOVASCULAR: Normal rate, regular rhythm and normal heart sounds.  No murmur heard. No BLE edema. PULMONARY: Effort normal and breath sounds normal.  No respiratory distress. PSYCHIATRIC: Patient has a normal mood and affect. behavior is normal. Judgment and thought content normal.  Vitals:   01/17/24 0854  BP: 126/78  Pulse: 89  Resp: 18  Temp: 98.5 F (36.9 C)  SpO2: 99%  Weight: 243 lb (110.2 kg)  Height: 5' 7 (1.702 m)    Body mass index is 38.06 kg/m.   PHQ2/9:    01/17/2024    8:56 AM 06/08/2022    1:34 PM 03/10/2022    1:09 PM 12/30/2020   10:35 AM 12/11/2020   11:04 AM  Depression screen PHQ 2/9  Decreased Interest 0 1 0  1  Down, Depressed, Hopeless 1 1 0  1  PHQ - 2 Score 1 2 0  2  Altered sleeping 1 1   1   Tired, decreased energy 1 3   1   Change in appetite 0 0   0  Feeling bad or failure about yourself  0 3   1  Trouble concentrating 0 1   0  Moving slowly or fidgety/restless 0 0   0  Suicidal thoughts 0 0   0  PHQ-9 Score 3 10   5   Difficult doing work/chores Not difficult at all    Somewhat difficult     Information is confidential and restricted. Go to Review Flowsheets to unlock data.    phq 9 is positive  Fall Risk:    01/17/2024    8:54 AM 06/08/2022    1:34 PM 03/10/2022    1:09 PM 12/11/2020   11:04 AM 07/21/2020   11:19 AM  Fall Risk   Falls in the past year? 0 0 0 0 0  Number falls in past yr: 0 0 0  0  Injury with Fall? 0 0 0  0  Risk for fall due to :  No Fall Risks     Follow up Falls evaluation completed Falls prevention discussed  Falls evaluation completed        Data saved with a previous flowsheet row definition     Assessment & Plan Obesity Discussed Zepbound for weight loss, covered by insurance. Emphasized dietary changes to prevent queasiness and malnourishment. Explained need to lose 5% body weight in three months to continue medication. She denies personal history of pancreatitis of family history of thyroid cancer - Start Zepbound at 2.5 mg for one month. - Report medication tolerance in three weeks. - Increase dose to 5 mg if tolerated after one month. - Follow-up  in three months to assess weight loss and medication tolerance. - Reduce portion sizes to one-third of current intake. - Start meals with protein, followed by vegetables, then fruit. - Recommend Greek yogurt as a protein source for breakfast.  Dysthymia Managed through therapy. Depression score is 3, indicating no need for medication. Prefers therapy over medication. - Continue therapy for dysthymia management.  Vitamin D  deficiency Plan to monitor levels as part of baseline labs before starting weight loss  medication. - Order vitamin D  level as part of baseline labs.  B12 deficiency Plan to monitor levels as part of baseline labs before starting weight loss medication. - Order B12 level as part of baseline labs. - Order CBC to check for anemia.  Low HDL cholesterol Plan to monitor lipid levels as part of baseline labs before starting weight loss medication. - Order lipid panel as part of baseline labs.  Seborrhea Capitis - sending nizoral to pharmacy   General Health Maintenance Needs to schedule a physical examination. Last Pap smear in 2022 was normal. - Schedule a physical examination.

## 2024-01-18 ENCOUNTER — Other Ambulatory Visit (HOSPITAL_COMMUNITY): Payer: Self-pay

## 2024-01-18 ENCOUNTER — Ambulatory Visit: Payer: Self-pay | Admitting: Family Medicine

## 2024-01-18 LAB — CBC WITH DIFFERENTIAL/PLATELET
Absolute Lymphocytes: 1846 {cells}/uL (ref 850–3900)
Absolute Monocytes: 602 {cells}/uL (ref 200–950)
Basophils Absolute: 41 {cells}/uL (ref 0–200)
Basophils Relative: 0.4 %
Eosinophils Absolute: 71 {cells}/uL (ref 15–500)
Eosinophils Relative: 0.7 %
HCT: 41.8 % (ref 35.0–45.0)
Hemoglobin: 13.2 g/dL (ref 11.7–15.5)
MCH: 28.8 pg (ref 27.0–33.0)
MCHC: 31.6 g/dL — ABNORMAL LOW (ref 32.0–36.0)
MCV: 91.1 fL (ref 80.0–100.0)
MPV: 9.8 fL (ref 7.5–12.5)
Monocytes Relative: 5.9 %
Neutro Abs: 7640 {cells}/uL (ref 1500–7800)
Neutrophils Relative %: 74.9 %
Platelets: 341 10*3/uL (ref 140–400)
RBC: 4.59 10*6/uL (ref 3.80–5.10)
RDW: 12.7 % (ref 11.0–15.0)
Total Lymphocyte: 18.1 %
WBC: 10.2 10*3/uL (ref 3.8–10.8)

## 2024-01-18 LAB — LIPID PANEL
Cholesterol: 164 mg/dL (ref <200)
HDL: 52 mg/dL (ref >=50)
LDL Cholesterol (Calc): 94 mg/dL
Non-HDL Cholesterol (Calc): 112 mg/dL (ref <130)
Total CHOL/HDL Ratio: 3.2 (calc) (ref <5.0)
Triglycerides: 87 mg/dL (ref <150)

## 2024-01-18 LAB — COMPREHENSIVE METABOLIC PANEL WITH GFR
AG Ratio: 1.7 (calc) (ref 1.0–2.5)
ALT: 8 U/L (ref 6–29)
AST: 11 U/L (ref 10–30)
Albumin: 4.5 g/dL (ref 3.6–5.1)
Alkaline phosphatase (APISO): 70 U/L (ref 31–125)
BUN/Creatinine Ratio: 8 (calc) (ref 6–22)
BUN: 6 mg/dL — ABNORMAL LOW (ref 7–25)
CO2: 27 mmol/L (ref 20–32)
Calcium: 9.8 mg/dL (ref 8.6–10.2)
Chloride: 103 mmol/L (ref 98–110)
Creat: 0.72 mg/dL (ref 0.50–0.97)
Globulin: 2.6 g/dL (ref 1.9–3.7)
Glucose, Bld: 91 mg/dL (ref 65–99)
Potassium: 4 mmol/L (ref 3.5–5.3)
Sodium: 138 mmol/L (ref 135–146)
Total Bilirubin: 0.4 mg/dL (ref 0.2–1.2)
Total Protein: 7.1 g/dL (ref 6.1–8.1)
eGFR: 109 mL/min/{1.73_m2} (ref >=60)

## 2024-01-18 LAB — B12 AND FOLATE PANEL
Folate: 6.6 ng/mL
Vitamin B-12: 348 pg/mL (ref 200–1100)

## 2024-01-18 LAB — HEMOGLOBIN A1C
Hgb A1c MFr Bld: 5.3 % (ref <5.7)
Mean Plasma Glucose: 105 mg/dL
eAG (mmol/L): 5.8 mmol/L

## 2024-01-18 LAB — VITAMIN D 25 HYDROXY (VIT D DEFICIENCY, FRACTURES): Vit D, 25-Hydroxy: 29 ng/mL — ABNORMAL LOW (ref 30–100)

## 2024-01-18 NOTE — Telephone Encounter (Signed)
 Pharmacy Patient Advocate Encounter  Received notification from [facility] that Prior Authorization for Zepbound 2.5MG /0.5ML pen-injectors  has been APPROVED from 01/17/24 to 07/15/24. Unable to obtain price due to refill too soon rejection, last fill date 01/17/24 next available fill date07/09/25   PA #/Case ID/Reference #: [id number]

## 2024-01-22 ENCOUNTER — Other Ambulatory Visit (HOSPITAL_COMMUNITY): Payer: Self-pay

## 2024-01-22 ENCOUNTER — Telehealth: Payer: Self-pay | Admitting: Pharmacy Technician

## 2024-01-22 NOTE — Telephone Encounter (Signed)
 Pharmacy Patient Advocate Encounter   Received notification from Patient Advice Request messages that prior authorization for Zepbound 2.5MG /0.5ML pen-injectors is required/requested.   Insurance verification completed.   The patient is insured through [employer] .   Per test claim:  WEGOVY is preferred by the insurance.  If suggested medication is appropriate, Please send in a new RX and discontinue this one. If not, please advise as to why it's not appropriate so that we may request a Prior Authorization. Please note, some preferred medications may still require a PA.  If the suggested medications have not been trialed and there are no contraindications to their use, the PA will not be submitted, as it will not be approved.  This is a complicated 1, the pt has double coverage her primary coverage PA was approved and her copay is $24.99 with the Zepbound voucher from [place]. Any [facility] automatically will apply the voucher but unfortunately if her pharmacy is telling her it's over $1000.00 she will have to go to [email or web address] and get the voucher herself and take to pharmacy. See below her charge is $24.99 per test bill   Now the other part to this is we can submit to her secondary which is Medicaid but they will deny the PA until she has tried and failed Wegovy for 3 to 6 months BUT we will also have to submit Wegovy PA to her primary insurance as well.

## 2024-01-22 NOTE — Telephone Encounter (Signed)
 This 1 is a bit complicated. New Encounter has been created for follow up. For additional info see Pharmacy Prior Auth telephone encounter from 01/22/24.

## 2024-01-22 NOTE — Telephone Encounter (Signed)
 See encounter from 01/22/24

## 2024-01-23 ENCOUNTER — Telehealth: Payer: Self-pay

## 2024-01-23 ENCOUNTER — Other Ambulatory Visit: Payer: Self-pay | Admitting: Family Medicine

## 2024-01-23 MED ORDER — WEGOVY 0.25 MG/0.5ML ~~LOC~~ SOAJ: 0.2500 mg | SUBCUTANEOUS | 0 refills | Status: DC

## 2024-01-23 NOTE — Telephone Encounter (Signed)
Please proceed with PA

## 2024-01-23 NOTE — Telephone Encounter (Signed)
 Copied from CRM [id number]. Topic: Clinical - Medication Prior Auth >> Jan 23, 2024 10:31 AM Avram MATSU wrote: Reason for CRM: patient is requesting a PA for [facility] since its preferred by the insurance. The PA is going to the [facility] if its still required.

## 2024-01-24 ENCOUNTER — Telehealth: Payer: Self-pay

## 2024-01-24 ENCOUNTER — Other Ambulatory Visit (HOSPITAL_COMMUNITY): Payer: Self-pay

## 2024-01-24 NOTE — Telephone Encounter (Signed)
 Copied from CRM [id number]. Topic: Clinical - Medication Prior Auth >> Jan 24, 2024  3:04 PM Santiya F wrote: Reason for CRM: Patient is calling in because she needs her prior authorization for [facility] ran through [employer], not [employer]. The copay for [employer] is cheaper than it through [facility]. Please advise.

## 2024-01-24 NOTE — Telephone Encounter (Signed)
 Just to verify BCBS is still Healthy Blue correct?

## 2024-01-24 NOTE — Telephone Encounter (Signed)
 Pharmacy Patient Advocate Encounter  Received notification from [facility] that Prior Authorization for Wegovy 0.25MG /0.5ML auto-injectors has been APPROVED from 01/24/24 to 07/22/24. Unable to obtain price due to refill too soon rejection, last fill date 01/24/24 next available fill date07/16/25   PA #/Case ID/Reference #:  [id number]

## 2024-01-24 NOTE — Telephone Encounter (Signed)
 Copied from CRM [id number]. Topic: Clinical - Prescription Issue >> Jan 24, 2024 11:44 AM Nathanel BROCKS wrote: Reason for CRM:  Semaglutide-Weight Management (WEGOVY) 0.25 MG/0.5ML SOAJ   Preauthorization is being run through the wrong ins company it needs to be ran through, healthy blue ins. Please correct and rerun for a preauthorization. Pt can not afford it under the one we ran it through first.

## 2024-01-24 NOTE — Telephone Encounter (Signed)
 Pt states a PA need to be done through BCBS-has been done and Medicaid [employer] in order to have a lower COPAY. Please advice

## 2024-01-24 NOTE — Telephone Encounter (Signed)
 Good morning, PA submitted for [facility] and is pending

## 2024-01-24 NOTE — Telephone Encounter (Signed)
 Pharmacy Patient Advocate Encounter   Received notification from Pt Calls Messages that prior authorization for Wegovy 0.25MG /0.5ML auto-injectors is required/requested.   Insurance verification completed.   The patient is insured through [facility] .   Per test claim: PA required; PA submitted to above mentioned insurance via CoverMyMeds Key/confirmation #/EOC [id number] Status is pending

## 2024-01-25 ENCOUNTER — Telehealth: Payer: Self-pay | Admitting: Pharmacy Technician

## 2024-01-25 ENCOUNTER — Other Ambulatory Visit (HOSPITAL_COMMUNITY): Payer: Self-pay

## 2024-01-25 NOTE — Telephone Encounter (Signed)
 Pharmacy Patient Advocate Encounter   Received notification from Pt Calls Messages that prior authorization for [facility] 0.25MG  is required/requested.   Insurance verification completed.   The patient is insured through [facility] .   Per test claim: PA required; PA submitted to above mentioned insurance via Phone Key/confirmation #/EOC [id number] Status is pending

## 2024-01-25 NOTE — Telephone Encounter (Signed)
 Good morning, no she is double covered BC[employer] is her primary and [employer] is her secondary. I tried to submit to [facility] yesterday when I submitted the PA to [facility] but they stated it had to go through her primary 1st. I will check today to see if it will allow me to submit since BCBS approved it.

## 2024-01-26 ENCOUNTER — Other Ambulatory Visit (HOSPITAL_COMMUNITY): Payer: Self-pay

## 2024-01-26 NOTE — Telephone Encounter (Signed)
 Pharmacy Patient Advocate Encounter  Received notification from [facility] that Prior Authorization for WEGOVY  0.25 MG has been APPROVED from 01/25/24 to 07/23/24. Ran test claim, Copay is $4.00. This test claim was processed through [facility]- copay amounts may vary at other pharmacies due to pharmacy/plan contracts, or as the patient moves through the different stages of their insurance plan.   PA #/Case ID/Reference #: [id number]

## 2024-02-13 ENCOUNTER — Other Ambulatory Visit: Payer: Self-pay | Admitting: Family Medicine

## 2024-02-13 MED ORDER — WEGOVY 0.5 MG/0.5ML ~~LOC~~ SOAJ: 0.5000 mg | SUBCUTANEOUS | 0 refills | Status: DC

## 2024-02-21 ENCOUNTER — Other Ambulatory Visit: Payer: Self-pay | Admitting: Family Medicine

## 2024-02-21 DIAGNOSIS — L210 Seborrhea capitis: Secondary | ICD-10-CM

## 2024-03-14 ENCOUNTER — Other Ambulatory Visit: Payer: Self-pay | Admitting: Family Medicine

## 2024-03-14 DIAGNOSIS — L210 Seborrhea capitis: Secondary | ICD-10-CM

## 2024-03-19 ENCOUNTER — Other Ambulatory Visit: Payer: Self-pay

## 2024-03-19 ENCOUNTER — Ambulatory Visit (INDEPENDENT_AMBULATORY_CARE_PROVIDER_SITE_OTHER): Admitting: Family Medicine

## 2024-03-19 ENCOUNTER — Encounter: Payer: Self-pay | Admitting: Family Medicine

## 2024-03-19 VITALS — BP 120/74 | HR 96 | Resp 16 | Ht 66.75 in | Wt 239.1 lb

## 2024-03-19 DIAGNOSIS — Z0000 Encounter for general adult medical examination without abnormal findings: Secondary | ICD-10-CM

## 2024-03-19 DIAGNOSIS — Z1231 Encounter for screening mammogram for malignant neoplasm of breast: Secondary | ICD-10-CM | POA: Diagnosis not present

## 2024-03-19 DIAGNOSIS — Z6835 Body mass index (BMI) 35.0-35.9, adult: Secondary | ICD-10-CM

## 2024-03-19 MED ORDER — SEMAGLUTIDE-WEIGHT MANAGEMENT 1 MG/0.5ML ~~LOC~~ SOAJ: 1.0000 mg | SUBCUTANEOUS | 0 refills | Status: DC

## 2024-03-19 NOTE — Patient Instructions (Signed)

## 2024-03-19 NOTE — Progress Notes (Signed)
 Name: Anna Beck   MRN: [id number]    DOB: [birth year]   Date:03/19/2024       Progress Note  Subjective  Chief Complaint  Chief Complaint  Patient presents with   Annual Exam    HPI  Patient presents for annual CPE.  Diet: she cooks mostly at home, grilled chicken, salmon and vegetables. Cutting down on carbohydrates. Eating more yogurt for breakfast , also likes fruit  Exercise: she is doing home exercises, weight training and walks on a pad   Last Eye Exam: completed Last Dental Exam: completed  Flowsheet Row Office Visit from 03/19/2024 in [facility]  AUDIT-C Score 3    Depression: Phq 9 is  negative    03/19/2024    2:52 PM 01/17/2024    8:56 AM 06/08/2022    1:34 PM 03/10/2022    1:09 PM 12/30/2020   10:35 AM  Depression screen PHQ 2/9  Decreased Interest 0 0 1 0   Down, Depressed, Hopeless 0 1 1 0   PHQ - 2 Score 0 1 2 0   Altered sleeping  1 1    Tired, decreased energy  1 3    Change in appetite  0 0    Feeling bad or failure about yourself   0 3    Trouble concentrating  0 1    Moving slowly or fidgety/restless  0 0    Suicidal thoughts  0 0    PHQ-9 Score  3 10    Difficult doing work/chores  Not difficult at all        Information is confidential and restricted. Go to Review Flowsheets to unlock data.   Hypertension: BP Readings from Last 3 Encounters:  03/19/24 120/74  01/17/24 126/78  06/08/22 118/72   Obesity: Wt Readings from Last 3 Encounters:  03/19/24 239 lb 1.6 oz (108.5 kg)  01/17/24 243 lb (110.2 kg)  06/08/22 242 lb (109.8 kg)   BMI Readings from Last 3 Encounters:  03/19/24 37.73 kg/m  01/17/24 38.06 kg/m  06/08/22 37.90 kg/m     Vaccines: reviewed with the patient.   Hep C Screening: completed STD testing and prevention (HIV/chl/gon/syphilis): N/A Intimate partner violence: negative screen  Sexual History : one partner , no problems  Menstrual History/LMP/Abnormal Bleeding: Nexplanon  , cycles  are irregular Discussed importance of follow up if any post-menopausal bleeding: not applicable  Incontinence Symptoms: negative for symptoms   Breast cancer:  - Last Mammogram: discussed getting first one when she turns 38  - BRCA gene screening: N/A  Osteoporosis Prevention : Discussed high calcium and vitamin D  supplementation, weight bearing exercises Bone density :not applicable   Cervical cancer screen: she sees gyn - and will go back soon Skin cancer: Discussed monitoring for atypical lesions  Colorectal cancer: N/A     Advanced Care Planning: A voluntary discussion about advance care planning including the explanation and discussion of advance directives.  Discussed health care proxy and Living will, and the patient was able to identify a health care proxy as mother .  Patient does not have a living will and power of attorney of health care   Patient Active Problem List   Diagnosis Date Noted   Chondromalacia of left patella 03/28/2019   Vitamin D  deficiency 02/27/2019   BMI 35.0-35.9,adult 02/27/2019   ASCUS of cervix with negative high risk HPV 02/27/2019   Closed fracture of base of fifth metatarsal bone 02/25/2019   Closed fracture of lateral  malleolus 02/25/2019   Menometrorrhagia 10/31/2018   History of uterine fibroid 10/31/2018    Past Surgical History:  Procedure Laterality Date   CHOLECYSTECTOMY     DG SCOLIOSIS STUDY ENTIRE SPINE ([facility] HX)     States corrective surgery of scoliosis   ROOT CANAL     SMALL INTESTINE SURGERY  1986   Pyloric stenosis   SPINE SURGERY  1997   TONSILLECTOMY Bilateral 05/18/2018   Procedure: TONSILLECTOMY;  Surgeon: Herminio Miu, MD;  Location: [facility];  Service: ENT;  Laterality: Bilateral;    Family History  Problem Relation Age of Onset   Hypertension Mother    Depression Mother    Alcohol abuse Mother    Stroke Mother    Alcohol abuse Father    Asthma Father    Asthma Sister    Alcohol abuse Sister     Heart disease Maternal Grandmother    Alzheimer's disease Paternal Grandmother    Depression Sister    Alcohol abuse Sister    Asthma Sister    Asthma Daughter     Social History   Socioeconomic History   Marital status: Single    Spouse name: Cathlyn    Number of children: 2   Years of education: Not on file   Highest education level: Master's degree (e.g., MA, MS, MEng, MEd, MSW, MBA)  Occupational History   Not on file  Tobacco Use   Smoking status: Never   Smokeless tobacco: Never   Tobacco comments:    age 44 tried it, didn't like it  Vaping Use   Vaping status: Never Used  Substance and Sexual Activity   Alcohol use: Yes    Comment: Social drinker mostly   Drug use: No   Sexual activity: Yes    Birth control/protection: Implant, None  Other Topics Concern   Not on file  Social History Narrative   ** Merged History Encounter **       Lives with the father or her youngest child   Social Drivers of Corporate investment banker Strain: Low Risk  (03/15/2024)   Overall Financial Resource Strain (CARDIA)    Difficulty of Paying Living Expenses: Not very hard  Food Insecurity: No Food Insecurity (03/15/2024)   Hunger Vital Sign    Worried About Running Out of Food in the Last Year: Never true    Ran Out of Food in the Last Year: Never true  Transportation Needs: No Transportation Needs (03/15/2024)   PRAPARE - Administrator, Civil Service (Medical): No    Lack of Transportation (Non-Medical): No  Physical Activity: Insufficiently Active (03/15/2024)   Exercise Vital Sign    Days of Exercise per Week: 4 days    Minutes of Exercise per Session: 20 min  Stress: No Stress Concern Present (03/15/2024)   [employer] of Occupational Health - Occupational Stress Questionnaire    Feeling of Stress: Only a little  Social Connections: Socially Isolated (03/15/2024)   Social Connection and Isolation Panel    Frequency of Communication with Friends and  Family: More than three times a week    Frequency of Social Gatherings with Friends and Family: Patient declined    Attends Religious Services: Patient declined    Database administrator or Organizations: No    Attends Banker Meetings: Not on file    Marital Status: Never married  Intimate Partner Violence: Not At Risk (03/19/2024)   Humiliation, Afraid, Rape, and Kick questionnaire  Fear of Current or Ex-Partner: No    Emotionally Abused: No    Physically Abused: No    Sexually Abused: No     Current Outpatient Medications:    ketoconazole  (NIZORAL ) 2 % shampoo, Apply 1 Application topically 2 (two) times a week., Disp: 120 mL, Rfl: 0   Semaglutide -Weight Management (WEGOVY ) 0.5 MG/0.5ML SOAJ, Inject 0.5 mg into the skin once a week., Disp: 2 mL, Rfl: 0  Allergies  Allergen Reactions   Percocet [Oxycodone -Acetaminophen ] Itching     ROS  Constitutional: Negative for fever or weight change.  Respiratory: Negative for cough and shortness of breath.   Cardiovascular: Negative for chest pain or palpitations.  Gastrointestinal: Negative for abdominal pain, no bowel changes.  Musculoskeletal: Negative for gait problem or joint swelling.  Skin: Negative for rash.  Neurological: Negative for dizziness or headache.  No other specific complaints in a complete review of systems (except as listed in HPI above).   Objective  Vitals:   03/19/24 1453  BP: 120/74  Pulse: 96  Resp: 16  SpO2: 100%  Weight: 239 lb 1.6 oz (108.5 kg)  Height: 5' 6.75 (1.695 m)    Body mass index is 37.73 kg/m.  Physical Exam  Constitutional: Patient appears well-developed and well-nourished. No distress.  HENT: Head: Normocephalic and atraumatic. Ears: B TMs ok, no erythema or effusion; Nose: Nose normal. Mouth/Throat: Oropharynx is clear and moist. No oropharyngeal exudate.  Eyes: Conjunctivae and EOM are normal. Pupils are equal, round, and reactive to light. No scleral icterus.   Neck: Normal range of motion. Neck supple. No JVD present. No thyromegaly present.  Cardiovascular: Normal rate, regular rhythm and normal heart sounds.  No murmur heard. No BLE edema. Pulmonary/Chest: Effort normal and breath sounds normal. No respiratory distress. Abdominal: Soft. Bowel sounds are normal, no distension. There is no tenderness. no masses Breast: no lumps or masses, no nipple discharge or rashes FEMALE GENITALIA:  Not done  RECTAL: not done  Musculoskeletal: Normal range of motion, no joint effusions. No gross deformities Neurological: he is alert and oriented to person, place, and time. No cranial nerve deficit. Coordination, balance, strength, speech and gait are normal.  Skin: Skin is warm and dry. No rash noted. No erythema.  Psychiatric: Patient has a normal mood and affect. behavior is normal. Judgment and thought content normal.     Assessment & Plan  1. Well adult exam (Primary)   2. Breast cancer screening by mammogram  Start at age 89    -USPSTF grade A and B recommendations reviewed with patient; age-appropriate recommendations, preventive care, screening tests, etc discussed and encouraged; healthy living encouraged; see AVS for patient education given to patient -Discussed importance of 150 minutes of physical activity weekly, eat two servings of fish weekly, eat one serving of tree nuts ( cashews, pistachios, pecans, almonds.SABRA) every other day, eat 6 servings of fruit/vegetables daily and drink plenty of water and avoid sweet beverages.   -Reviewed Health Maintenance: Yes.

## 2024-03-22 ENCOUNTER — Encounter: Payer: Self-pay | Admitting: Family Medicine

## 2024-04-12 ENCOUNTER — Encounter: Payer: Self-pay | Admitting: Family Medicine

## 2024-04-15 ENCOUNTER — Other Ambulatory Visit: Payer: Self-pay | Admitting: Family Medicine

## 2024-04-15 DIAGNOSIS — Z6835 Body mass index (BMI) 35.0-35.9, adult: Secondary | ICD-10-CM

## 2024-04-15 MED ORDER — SEMAGLUTIDE-WEIGHT MANAGEMENT 1 MG/0.5ML ~~LOC~~ SOAJ: 1.0000 mg | SUBCUTANEOUS | 0 refills | Status: DC

## 2024-04-22 ENCOUNTER — Encounter: Payer: Self-pay | Admitting: Family Medicine

## 2024-04-22 ENCOUNTER — Ambulatory Visit (INDEPENDENT_AMBULATORY_CARE_PROVIDER_SITE_OTHER): Admitting: Family Medicine

## 2024-04-22 VITALS — BP 110/78 | HR 98 | Resp 16 | Ht 66.75 in | Wt 224.9 lb

## 2024-04-22 DIAGNOSIS — E559 Vitamin D deficiency, unspecified: Secondary | ICD-10-CM

## 2024-04-22 DIAGNOSIS — Z6835 Body mass index (BMI) 35.0-35.9, adult: Secondary | ICD-10-CM

## 2024-04-22 DIAGNOSIS — E786 Lipoprotein deficiency: Secondary | ICD-10-CM | POA: Diagnosis not present

## 2024-04-22 DIAGNOSIS — E538 Deficiency of other specified B group vitamins: Secondary | ICD-10-CM

## 2024-04-22 MED ORDER — WEGOVY 1.7 MG/0.75ML ~~LOC~~ SOAJ: 1.7000 mg | SUBCUTANEOUS | 0 refills | Status: DC

## 2024-04-22 NOTE — Progress Notes (Signed)
 Name: Anna Beck   MRN: [id number]    DOB: May 26, 1986   Date:04/22/2024       Progress Note  Subjective  Chief Complaint  Chief Complaint  Patient presents with   Medical Management of Chronic Issues   Discussed the use of AI scribe software for clinical note transcription with the patient, who gave verbal consent to proceed.  History of Present Illness Anna Beck is a 38 year old female with obesity who presents for a three-month follow-up after starting Wegovy .  Three months after starting Wegovy  for weight management, she has experienced a significant weight reduction from 245 pounds to 224.9 pounds, with her BMI decreasing from 38.06 to 35.49. She attributes this improvement to a reduction in cravings, particularly for sweets, and has been choosing water over juice. Losing weight has positively impacted her mood and overall well-being.  Her weight issues began after her pregnancies, with the last one occurring six years ago. Her pre-pregnancy weight was 150 pounds, but she is uncertain about returning to that weight, expressing concerns about looking 'real sick' if she loses too much. She has achieved a 10% weight loss and remains motivated to continue her healthy habits.  Her insurance situation involves her work insurance being out of network, but Medicaid is currently covering the cost of Wegovy .  She is currently taking vitamin B12, vitamin D , and folate supplements. Her vitamin D  level was 29 in June, and her B12 was slightly low, with folic acid  barely normal.     Patient Active Problem List   Diagnosis Date Noted   Chondromalacia of left patella 03/28/2019   Vitamin D  deficiency 02/27/2019   BMI 35.0-35.9,adult 02/27/2019   ASCUS of cervix with negative high risk HPV 02/27/2019   Closed fracture of base of fifth metatarsal bone 02/25/2019   Closed fracture of lateral malleolus 02/25/2019   Menometrorrhagia 10/31/2018   History of uterine fibroid 10/31/2018     Past Surgical History:  Procedure Laterality Date   CHOLECYSTECTOMY     DG SCOLIOSIS STUDY ENTIRE SPINE ([facility] HX)     States corrective surgery of scoliosis   ROOT CANAL     SMALL INTESTINE SURGERY  1986   Pyloric stenosis   SPINE SURGERY  1997   TONSILLECTOMY Bilateral 05/18/2018   Procedure: TONSILLECTOMY;  Surgeon: Herminio Miu, MD;  Location: [facility];  Service: ENT;  Laterality: Bilateral;    Family History  Problem Relation Age of Onset   Hypertension Mother    Depression Mother    Alcohol abuse Mother    Stroke Mother    Alcohol abuse Father    Asthma Father    Asthma Sister    Alcohol abuse Sister    Heart disease Maternal Grandmother    Alzheimer's disease Paternal Grandmother    Depression Sister    Alcohol abuse Sister    Asthma Sister    Asthma Daughter     Social History   Tobacco Use   Smoking status: Never   Smokeless tobacco: Never   Tobacco comments:    age 85 tried it, didn't like it  Substance Use Topics   Alcohol use: Yes    Comment: Social drinker mostly     Current Outpatient Medications:    ketoconazole  (NIZORAL ) 2 % shampoo, Apply 1 Application topically 2 (two) times a week., Disp: 120 mL, Rfl: 0   semaglutide -weight management (WEGOVY ) 1 MG/0.5ML SOAJ SQ injection, Inject 1 mg into the skin once a week., Disp: 2  mL, Rfl: 0  Allergies  Allergen Reactions   Percocet [Oxycodone -Acetaminophen ] Itching    I personally reviewed active problem list, medication list, allergies with the patient/caregiver today.   ROS  Ten systems reviewed and is negative except as mentioned in HPI    Objective Physical Exam  CONSTITUTIONAL: Patient appears well-developed and well-nourished.  No distress. HEENT: Head atraumatic, normocephalic, neck supple. CARDIOVASCULAR: Normal rate, regular rhythm and normal heart sounds.  No murmur heard. No BLE edema. PULMONARY: Effort normal and breath sounds normal. No respiratory  distress. ABDOMINAL: There is no tenderness or distention. MUSCULOSKELETAL: Normal gait. Without gross motor or sensory deficit. PSYCHIATRIC: Patient has a normal mood and affect. behavior is normal. Judgment and thought content normal.  Vitals:   04/22/24 1438  BP: 110/78  Pulse: 98  Resp: 16  SpO2: 97%  Weight: 224 lb 14.4 oz (102 kg)  Height: 5' 6.75 (1.695 m)    Body mass index is 35.49 kg/m.   PHQ2/9:    04/22/2024    2:35 PM 03/19/2024    2:52 PM 01/17/2024    8:56 AM 06/08/2022    1:34 PM 03/10/2022    1:09 PM  Depression screen PHQ 2/9  Decreased Interest 0 0 0 1 0  Down, Depressed, Hopeless 0 0 1 1 0  PHQ - 2 Score 0 0 1 2 0  Altered sleeping 0  1 1   Tired, decreased energy 0  1 3   Change in appetite 0  0 0   Feeling bad or failure about yourself  0  0 3   Trouble concentrating 0  0 1   Moving slowly or fidgety/restless 0  0 0   Suicidal thoughts 0  0 0   PHQ-9 Score 0  3 10   Difficult doing work/chores Not difficult at all  Not difficult at all      phq 9 is negative  Fall Risk:    04/22/2024    2:35 PM 03/19/2024    2:52 PM 01/17/2024    8:54 AM 06/08/2022    1:34 PM 03/10/2022    1:09 PM  Fall Risk   Falls in the past year? 0 0 0 0 0  Number falls in past yr: 0 0 0 0 0  Injury with Fall? 0 0 0 0 0  Risk for fall due to : No Fall Risks No Fall Risks  No Fall Risks   Follow up Falls evaluation completed Falls evaluation completed Falls evaluation completed Falls prevention discussed  Falls evaluation completed      Data saved with a previous flowsheet row definition      Assessment & Plan Obesity, BMI >35, on weight loss therapy BMI reduced from 38.06 to 35.49 with Wegovy . Over 20 pounds lost. Improved diet and reduced cravings noted. Discussed potential weight loss plateau and Medicaid coverage ending in October. - Increase Wegovy  dose to 1.7 mg. - Check Medicaid coverage for weight loss medication. - Encourage healthy eating and physical  activity. - Advise avoiding sweet beverages and maintaining small portion sizes.  Dyslipidemia Improved HDL levels likely due to weight loss and dietary changes. - Continue lifestyle modifications to maintain lipid levels.  Vitamin D  deficiency Vitamin D  level was 29 in June, near normal range. - Continue vitamin D  supplementation.  Vitamin B12 deficiency B12 slightly low, folic acid  barely normal. Folate supplementation started. Daily supplementation not necessary. - Continue vitamin B12 supplementation every third day. - Continue folate supplementation.

## 2024-05-15 ENCOUNTER — Other Ambulatory Visit: Payer: Self-pay

## 2024-05-15 ENCOUNTER — Other Ambulatory Visit: Payer: Self-pay | Admitting: Family Medicine

## 2024-05-15 DIAGNOSIS — Z6835 Body mass index (BMI) 35.0-35.9, adult: Secondary | ICD-10-CM

## 2024-05-15 MED ORDER — WEGOVY 2.4 MG/0.75ML ~~LOC~~ SOAJ: 2.4000 mg | SUBCUTANEOUS | 0 refills | Status: DC

## 2024-05-27 DIAGNOSIS — R682 Dry mouth, unspecified: Secondary | ICD-10-CM | POA: Diagnosis not present

## 2024-06-07 ENCOUNTER — Telehealth: Payer: Self-pay | Admitting: Pharmacy Technician

## 2024-06-07 ENCOUNTER — Ambulatory Visit (INDEPENDENT_AMBULATORY_CARE_PROVIDER_SITE_OTHER): Admitting: Family Medicine

## 2024-06-07 ENCOUNTER — Other Ambulatory Visit (HOSPITAL_COMMUNITY): Payer: Self-pay

## 2024-06-07 ENCOUNTER — Encounter: Payer: Self-pay | Admitting: Family Medicine

## 2024-06-07 VITALS — BP 106/70 | HR 92 | Resp 16 | Ht 66.75 in | Wt 218.9 lb

## 2024-06-07 DIAGNOSIS — H9202 Otalgia, left ear: Secondary | ICD-10-CM

## 2024-06-07 DIAGNOSIS — E66811 Obesity, class 1: Secondary | ICD-10-CM | POA: Diagnosis not present

## 2024-06-07 DIAGNOSIS — Z1231 Encounter for screening mammogram for malignant neoplasm of breast: Secondary | ICD-10-CM

## 2024-06-07 DIAGNOSIS — L709 Acne, unspecified: Secondary | ICD-10-CM

## 2024-06-07 DIAGNOSIS — M3500 Sicca syndrome, unspecified: Secondary | ICD-10-CM | POA: Diagnosis not present

## 2024-06-07 MED ORDER — WEGOVY 2.4 MG/0.75ML ~~LOC~~ SOAJ: 2.4000 mg | SUBCUTANEOUS | 0 refills | Status: DC

## 2024-06-07 MED ORDER — TRETINOIN 0.05 % EX CREA: TOPICAL_CREAM | Freq: Every day | CUTANEOUS | 0 refills | Status: DC

## 2024-06-07 MED ORDER — FLUTICASONE PROPIONATE 50 MCG/ACT NA SUSP: 2.0000 | Freq: Every day | NASAL | 1 refills | Status: AC

## 2024-06-07 NOTE — Telephone Encounter (Signed)
 Pharmacy Patient Advocate Encounter   Received notification from CoverMyMeds that prior authorization for Tretinoin 0.05% cream  is required/requested.   Insurance verification completed.   The patient is insured through [facility].   Per test claim: PA required; PA started via CoverMyMeds. KEY [id number] . Waiting for clinical questions to populate.

## 2024-06-07 NOTE — Progress Notes (Signed)
 Name: [name]   MRN: [id number]    DOB: 02-06-1986   Date:06/07/2024       Progress Note  Subjective  Chief Complaint  Chief Complaint  Patient presents with   dry mouth    Onset month, lips feel chapped, inner cheeks possibly chapped, during night it hurts to swallow   Ear Pain    L ear, for a week    Discussed the use of AI scribe software for clinical note transcription with the patient, who gave verbal consent to proceed.  History of Present Illness Anna Beck is a 38 year old female who presents with dry mouth and left ear pain.  She has been experiencing a very dry mouth since October, which worsens at night, causing her to wake up feeling like she is choking and unable to swallow. She keeps a cup of water by her bed and drinks throughout the night and day. Additionally, she experiences a burning sensation on her lips and cheeks. She visited urgent care on October 20th, but she was unable to diagnose the issue. A subsequent dental visit on October 29th also did not reveal any abnormalities, as her mouth did not appear dry to the dentist. No snoring or waking up feeling tired, and no one has mentioned that she snores. Her Epworth Sleepiness Scale (ESS) score is 12. She has been using Biotene for dry mouth. No grinding of teeth at night.  She reports left ear pain that began about a week ago, after her visit to urgent care. She has had a previous ear infection but denies current nasal congestion or stuffiness.  She is currently taking Wegovy  for weight loss, with her last dose received in October. She started at a weight of 243-244 pounds in June and has since lost over 20 pounds, now weighing 218 pounds. She is on a 2.4 mg dose of Wegovy  and maintains her weight loss through walking, using a stepper, avoiding sweets, and ensuring adequate protein intake.  She also mentions experiencing itchy bumps on her face, which she suspects might be adult acne. These bumps itch  intensely at times, and she has tried various skin remedies without success.    Patient Active Problem List   Diagnosis Date Noted   Chondromalacia of left patella 03/28/2019   Vitamin D  deficiency 02/27/2019   BMI 35.0-35.9,adult 02/27/2019   ASCUS of cervix with negative high risk HPV 02/27/2019   Closed fracture of base of fifth metatarsal bone 02/25/2019   Closed fracture of lateral malleolus 02/25/2019   Menometrorrhagia 10/31/2018   History of uterine fibroid 10/31/2018    Social History   Tobacco Use   Smoking status: Never   Smokeless tobacco: Never   Tobacco comments:    age 78 tried it, didn't like it  Substance Use Topics   Alcohol use: Yes    Comment: Social drinker mostly     Current Outpatient Medications:    ketoconazole  (NIZORAL ) 2 % shampoo, Apply 1 Application topically 2 (two) times a week., Disp: 120 mL, Rfl: 0   semaglutide -weight management (WEGOVY ) 2.4 MG/0.75ML SOAJ SQ injection, Inject 2.4 mg into the skin once a week., Disp: 3 mL, Rfl: 0  Allergies  Allergen Reactions   Percocet [Oxycodone -Acetaminophen ] Itching    ROS  Ten systems reviewed and is negative except as mentioned in HPI    Objective  Vitals:   06/07/24 0916  BP: 106/70  Pulse: 92  Resp: 16  SpO2: 99%  Weight: 218 lb  14.4 oz (99.3 kg)  Height: 5' 6.75 (1.695 m)    Body mass index is 34.54 kg/m.   Physical Exam CONSTITUTIONAL: Patient appears well-developed and well-nourished. No distress. HEENT: Head atraumatic, normocephalic, neck supple. Oral cavity normal. Tongue slightly dry. Left ear without infection, minimal cerumen, canal clear. CARDIOVASCULAR: Normal rate, regular rhythm and normal heart sounds. No murmur heard. No BLE edema. PULMONARY: Effort normal and breath sounds normal. No respiratory distress. ABDOMINAL: There is no tenderness or distention. MUSCULOSKELETAL: Normal gait. Without gross motor or sensory deficit. PSYCHIATRIC: Patient has a normal  mood and affect. Behavior is normal. Judgment and thought content normal.    Assessment & Plan Dry mouth (xerostomia), evaluation for Sjogren syndrome and sleep apnea Persistent xerostomia with nocturnal worsening. Differential includes sleep apnea and Sjogren's syndrome due to symptoms and obesity. - Ordered blood test for Sjogren's syndrome. - Referred for sleep study. - Recommended humidifier if sleep apnea confirmed. - Advised non-alcohol based mouthwash and toothpaste.  Obesity, class 1, on Wegovy  therapy Class 1 obesity with significant weight loss on Wegovy  and lifestyle changes. Current BMI 34.54. - Prescribed Wegovy  2.4 mg for three months. - Continue lifestyle modifications including walking, stepper use, and dietary changes.  Left ear pain (otalgia) Left otalgia with no infection or wax buildup. Possible TMJ dysfunction or referred pain from bruxism. - Recommended over-the-counter Flomase nasal spray for one week. - Advised Tylenol  for pain management.  Acne Facial acne with itching, possibly stress-related. Low blood pressure limits treatment options. - Prescribed Retin A 0.05% with gradual increase in frequency. - Advised moisturizer post-application and sunscreen in the morning. - Recommended GoodRx for cost management if insurance does not cover Retin A.

## 2024-06-07 NOTE — Telephone Encounter (Signed)
 Pharmacy Patient Advocate Encounter  Received notification from [facility] that Prior Authorization for Tretinoin 0.05% cream has been APPROVED from 06/07/24 to 06/07/25. Ran test claim, Copay is $4.00. This test claim was processed through [facility]- copay amounts may vary at other pharmacies due to pharmacy/plan contracts, or as the patient moves through the different stages of their insurance plan.   PA #/Case ID/Reference #: [id number]

## 2024-06-08 LAB — SJOGRENS SYNDROME-A EXTRACTABLE NUCLEAR ANTIBODY: ENA SSA (RO) Ab: <0.2 AI (ref 0.0–0.9)

## 2024-06-08 LAB — SJOGRENS SYNDROME-B EXTRACTABLE NUCLEAR ANTIBODY: ENA SSB (LA) Ab: <0.2 AI (ref 0.0–0.9)

## 2024-06-10 ENCOUNTER — Ambulatory Visit: Payer: Self-pay | Admitting: Family Medicine

## 2024-06-14 ENCOUNTER — Other Ambulatory Visit: Payer: Self-pay | Admitting: Family Medicine

## 2024-06-14 DIAGNOSIS — M3500 Sicca syndrome, unspecified: Secondary | ICD-10-CM

## 2024-07-01 ENCOUNTER — Other Ambulatory Visit: Payer: Self-pay | Admitting: Family Medicine

## 2024-07-01 DIAGNOSIS — H9202 Otalgia, left ear: Secondary | ICD-10-CM

## 2024-07-01 DIAGNOSIS — M545 Low back pain, unspecified: Secondary | ICD-10-CM | POA: Diagnosis not present

## 2024-07-04 NOTE — Telephone Encounter (Signed)
 Too soon for refill.  Requested Prescriptions  Pending Prescriptions Disp Refills   fluticasone  (FLONASE ) 50 MCG/ACT nasal spray [Pharmacy Med Name: FLUTICASONE  PROP 50 MCG SPRAY] 48 mL 1    Sig: SPRAY 2 SPRAYS INTO EACH NOSTRIL EVERY DAY     Ear, Nose, and Throat: Nasal Preparations - Corticosteroids Passed - 07/04/2024  1:29 PM      Passed - Valid encounter within last 12 months    Recent Outpatient Visits           3 weeks ago Sicca, unspecified type   [facility] Glenard Mire, MD   2 months ago BMI 35.0-35.9,adult   [facility] Glenard Mire, MD   3 months ago Well adult exam   [facility] Glenard Mire, MD   5 months ago BMI 35.0-35.9,adult   [facility] Glenard Mire, MD

## 2024-07-09 ENCOUNTER — Other Ambulatory Visit: Payer: Self-pay | Admitting: Family Medicine

## 2024-07-11 NOTE — Telephone Encounter (Signed)
 Requested Prescriptions  Pending Prescriptions Disp Refills   tretinoin  (RETIN-A ) 0.05 % cream [Pharmacy Med Name: TRETINOIN  0.05% CREAM] 45 g 0    Sig: APPLY TO AFFECTED AREA EVERY DAY AT BEDTIME     Dermatology:  Acne preparations Passed - 07/11/2024 10:59 AM      Passed - Valid encounter within last 12 months    Recent Outpatient Visits           1 month ago Sicca, unspecified type   [facility] Glenard Mire, MD   2 months ago BMI 35.0-35.9,adult   [facility] Glenard Mire, MD   3 months ago Well adult exam   [facility] Glenard Mire, MD   5 months ago BMI 35.0-35.9,adult   [facility] Glenard Mire, MD

## 2024-07-16 ENCOUNTER — Other Ambulatory Visit (HOSPITAL_COMMUNITY): Payer: Self-pay

## 2024-07-23 ENCOUNTER — Telehealth: Payer: Self-pay | Admitting: Pharmacy Technician

## 2024-07-23 ENCOUNTER — Other Ambulatory Visit (HOSPITAL_COMMUNITY): Payer: Self-pay

## 2024-07-23 DIAGNOSIS — E66811 Obesity, class 1: Secondary | ICD-10-CM

## 2024-07-23 NOTE — Telephone Encounter (Signed)
 Pharmacy Patient Advocate Encounter   Received notification from [employer] that prior authorization for Wegovy  0.25MG /0.5ML auto-injectors is due for renewal.   Insurance verification completed.   The patient is insured through [facility].  Action: Medication is now available without a prior authorization.

## 2024-08-21 NOTE — Addendum Note (Signed)
 Addended by: YVONE WARREN BROCKS on: 08/21/2024 08:49 AM   Modules accepted: Orders

## 2024-08-27 ENCOUNTER — Ambulatory Visit: Admitting: Family Medicine

## 2024-08-27 ENCOUNTER — Encounter: Payer: Self-pay | Admitting: Family Medicine

## 2024-08-27 VITALS — BP 118/80 | HR 76 | Resp 16 | Ht 66.75 in | Wt 210.3 lb

## 2024-08-27 DIAGNOSIS — M5442 Lumbago with sciatica, left side: Secondary | ICD-10-CM | POA: Diagnosis not present

## 2024-08-27 DIAGNOSIS — M41124 Adolescent idiopathic scoliosis, thoracic region: Secondary | ICD-10-CM | POA: Diagnosis not present

## 2024-08-27 DIAGNOSIS — M47817 Spondylosis without myelopathy or radiculopathy, lumbosacral region: Secondary | ICD-10-CM | POA: Insufficient documentation

## 2024-08-27 DIAGNOSIS — G8929 Other chronic pain: Secondary | ICD-10-CM | POA: Insufficient documentation

## 2024-08-27 DIAGNOSIS — E66811 Obesity, class 1: Secondary | ICD-10-CM | POA: Diagnosis not present

## 2024-08-27 MED ORDER — WEGOVY 2.4 MG/0.75ML ~~LOC~~ SOAJ: 2.4000 mg | SUBCUTANEOUS | 0 refills | Status: AC

## 2024-08-27 NOTE — Progress Notes (Signed)
 Name: Anna Beck   MRN: [id number]    DOB: 08/15/1985   Date:08/27/2024       Progress Note  Subjective  Chief Complaint  Chief Complaint  Patient presents with   wegovy    Discussed the use of AI scribe software for clinical note transcription with the patient, who gave verbal consent to proceed.  History of Present Illness Anna Beck is a 39 year old female with obesity and scoliosis who presents for follow-up on Wegovy  treatment and evaluation of chronic back pain.  She has been on Wegovy  for weight loss since June, starting at a weight of 243-244 pounds and currently weighing 210 pounds, indicating a weight loss of at least 33 pounds. She experiences no significant side effects from the medication, such as constipation, nausea, or vomiting, and finds it effective in curbing her appetite. She is considering using an app to track her diet, particularly to monitor protein intake, as she aims to consume around 100 grams of protein per day, potentially supplemented by protein shakes.  She experiences chronic back pain daily, primarily in the lower back with radiation to the left leg. She has a history of scoliosis, having undergone corrective surgery at age 81. She reports that recent x-rays from December at [facility] showed an S-curve in her spine, arthritis, and degenerative disc disease, according to what she was told at the time. The pain is described as throbbing and sometimes shooting down her leg. She manages the pain with Tylenol , ibuprofen , and cyclobenzaprine , but previously used meloxicam, which she no longer has. The pain affects her mobility and work, prompting her to purchase a standing desk to alleviate discomfort.  Her family history includes degenerative disc disease, similar to her mother's condition. She is actively trying to avoid surgery and is exploring other options for pain management.  She was supposed to have a mammogram in November but delayed it due to an  insurance change. She plans to schedule it soon. She also had blood work done in June of the previous year.    Impression X-ray lumbar spine done at urgent care  1.  No acute fracture or traumatic malalignment. 2.  S-shaped scoliotic curvature of the thoracolumbar spine. Partially visualized thoracolumbar fusion rods. 3.  Mild multilevel degenerative disc changes. 4.  Facet arthropathy is most pronounced at L4-L5 and L5-S1. 5.  Calcified structure projecting over the anatomic pelvis, could reflect a fibroid. Pelvic ultrasound could further evaluate.  Patient Active Problem List   Diagnosis Date Noted   Chondromalacia of left patella 03/28/2019   Vitamin D  deficiency 02/27/2019   BMI 35.0-35.9,adult 02/27/2019   ASCUS of cervix with negative high risk HPV 02/27/2019   Closed fracture of base of fifth metatarsal bone 02/25/2019   Closed fracture of lateral malleolus 02/25/2019   Menometrorrhagia 10/31/2018   History of uterine fibroid 10/31/2018    Past Surgical History:  Procedure Laterality Date   CHOLECYSTECTOMY     DG SCOLIOSIS STUDY ENTIRE SPINE ([facility] HX)     States corrective surgery of scoliosis   ROOT CANAL     SMALL INTESTINE SURGERY  1986   Pyloric stenosis   SPINE SURGERY  1997   TONSILLECTOMY Bilateral 05/18/2018   Procedure: TONSILLECTOMY;  Surgeon: Herminio Miu, MD;  Location: [facility];  Service: ENT;  Laterality: Bilateral;    Family History  Problem Relation Age of Onset   Hypertension Mother    Depression Mother    Alcohol abuse Mother  Stroke Mother    Alcohol abuse Father    Asthma Father    Asthma Sister    Alcohol abuse Sister    Heart disease Maternal Grandmother    Alzheimer's disease Paternal Grandmother    Depression Sister    Alcohol abuse Sister    Asthma Sister    Asthma Daughter     Social History   Tobacco Use   Smoking status: Never   Smokeless tobacco: Never   Tobacco comments:    age 40 tried it, didn't like  it  Substance Use Topics   Alcohol use: Yes    Comment: Social drinker mostly    Current Medications[1]  Allergies[2]  I personally reviewed active problem list, medication list, allergies, family history with the patient/caregiver today.   ROS  Ten systems reviewed and is negative except as mentioned in HPI    Objective Physical Exam MEASUREMENTS: Weight- 210. CONSTITUTIONAL: Patient appears well-developed and well-nourished. No distress. HEENT: Head atraumatic, normocephalic, neck supple. CARDIOVASCULAR: Normal rate, regular rhythm and normal heart sounds. No murmur heard. No BLE edema. PULMONARY: Effort normal and breath sounds normal. No respiratory distress. ABDOMINAL: There is no tenderness or distention. MUSCULOSKELETAL: Normal gait. Without gross motor or sensory deficit. Spine without acute tenderness. PSYCHIATRIC: Patient has a normal mood and affect. Behavior is normal. Judgment and thought content normal.  Vitals:   08/27/24 1107  BP: 118/80  Pulse: 76  Resp: 16  SpO2: 98%  Weight: 210 lb 4.8 oz (95.4 kg)  Height: 5' 6.75 (1.695 m)    Body mass index is 33.18 kg/m.  Recent Results (from the past 2160 hours)  Sjogrens syndrome-A extractable nuclear antibody     Status: None   Collection Time: 06/07/24  5:14 PM  Result Value Ref Range   ENA SSA (RO) Ab <0.2 0.0 - 0.9 AI  Sjogrens syndrome-B extractable nuclear antibody     Status: None   Collection Time: 06/07/24  5:14 PM  Result Value Ref Range   ENA SSB (LA) Ab <0.2 0.0 - 0.9 AI     PHQ2/9:    08/27/2024   11:07 AM 06/07/2024    9:14 AM 04/22/2024    2:35 PM 03/19/2024    2:52 PM 01/17/2024    8:56 AM  Depression screen PHQ 2/9  Decreased Interest 0 0 0 0 0  Down, Depressed, Hopeless 0 0 0 0 1  PHQ - 2 Score 0 0 0 0 1  Altered sleeping  0 0  1  Tired, decreased energy  0 0  1  Change in appetite  0 0  0  Feeling bad or failure about yourself   0 0  0  Trouble concentrating  0 0  0  Moving  slowly or fidgety/restless  0 0  0  Suicidal thoughts  0 0  0  PHQ-9 Score  0 0   3   Difficult doing work/chores  Not difficult at all Not difficult at all  Not difficult at all     Data saved with a previous flowsheet row definition    phq 9 is negative  Fall Risk:    08/27/2024   11:07 AM 06/07/2024    9:13 AM 04/22/2024    2:35 PM 03/19/2024    2:52 PM 01/17/2024    8:54 AM  Fall Risk   Falls in the past year? 0 0 0 0 0  Number falls in past yr: 0 0 0 0 0  Injury with Fall?  0 0  0  0  0   Risk for fall due to : No Fall Risks No Fall Risks No Fall Risks No Fall Risks   Follow up Falls evaluation completed Falls evaluation completed Falls evaluation completed Falls evaluation completed Falls evaluation completed     Data saved with a previous flowsheet row definition      Assessment & Plan Obesity Wegovy  effective with 33-pound weight loss since June. No significant side effects except appetite suppression. Current weight 210 pounds, achieving goal of at least 5% weight loss. - Continue Wegovy  at 2.4 mg dose. - Encouraged increased protein intake using [employer] app. - Advised to increase physical activity.  Chronic low back pain with left-sided sciatica and facet arthropathy Chronic low back pain with left-sided sciatica likely due to facet arthropathy and degenerative disc disease. Pain managed with Tylenol , ibuprofen , and cyclobenzaprine . Surgery not preferred. - Referred to orthopedic specialist for evaluation and management options. - Advised on core strengthening exercises and physical therapy. - Discussed potential benefits of injections and physical therapy.  Adolescent idiopathic scoliosis, post corrective surgery Corrective surgery at age 87. Current imaging shows changes consistent with degenerative disc disease and facet arthropathy.  General health maintenance Discussion of mammogram scheduling due to insurance change. Blood work last done in June of  last year. - Schedule mammogram. - Plan for blood work during next physical.        [1]  Current Outpatient Medications:    fluticasone  (FLONASE ) 50 MCG/ACT nasal spray, Place 2 sprays into both nostrils daily., Disp: 16 g, Rfl: 1   ketoconazole  (NIZORAL ) 2 % shampoo, Apply 1 Application topically 2 (two) times a week., Disp: 120 mL, Rfl: 0   semaglutide -weight management (WEGOVY ) 2.4 MG/0.75ML SOAJ SQ injection, Inject 2.4 mg into the skin once a week., Disp: 9 mL, Rfl: 0   tretinoin  (RETIN-A ) 0.05 % cream, APPLY TO AFFECTED AREA EVERY DAY AT BEDTIME, Disp: 45 g, Rfl: 0 [2]  Allergies Allergen Reactions   Percocet [Oxycodone -Acetaminophen ] Itching

## 2024-08-28 ENCOUNTER — Telehealth: Payer: Self-pay | Admitting: Pharmacy Technician

## 2024-08-28 ENCOUNTER — Other Ambulatory Visit (HOSPITAL_COMMUNITY): Payer: Self-pay

## 2024-08-28 NOTE — Telephone Encounter (Signed)
 Pharmacy Patient Advocate Encounter   Received notification from Patient Advice Request messages that prior authorization for Wegovy  2.4 mg/0.1ml is required/requested.   Insurance verification completed.   The patient is insured through [employer] and [employer].   Per test claim: The current 84 day co-pay is, $4.00.  No PA needed at this time. This test claim was processed through [facility]- copay amounts may vary at other pharmacies due to pharmacy/plan contracts, or as the patient moves through the different stages of their insurance plan.

## 2024-08-28 NOTE — Telephone Encounter (Signed)
 PA request has been Received. New Encounter has been or will be created for follow up. For additional info see Pharmacy Prior Auth telephone encounter from 08/28/24.

## 2024-08-30 ENCOUNTER — Telehealth: Payer: Self-pay

## 2024-08-30 NOTE — Telephone Encounter (Signed)
 No answer left detailed vm to advice her we received a letter from [facility], informing Wegovy  was recalled on 07/31/24 due to hair found in a prefilled syringe. Patient can go to pharmacy and advise it was recalled.

## 2024-09-05 ENCOUNTER — Ambulatory Visit
Admission: RE | Admit: 2024-09-05 | Discharge: 2024-09-05 | Disposition: A | Source: Ambulatory Visit | Attending: Family Medicine | Admitting: Family Medicine

## 2024-09-05 ENCOUNTER — Ambulatory Visit: Admitting: Family Medicine

## 2024-09-05 DIAGNOSIS — Z1231 Encounter for screening mammogram for malignant neoplasm of breast: Secondary | ICD-10-CM

## 2024-09-09 ENCOUNTER — Ambulatory Visit: Admitting: Family Medicine

## 2024-10-04 ENCOUNTER — Encounter: Admitting: Certified Nurse Midwife
